# Patient Record
Sex: Female | Born: 2000 | Hispanic: No | Marital: Single | Smoking: Never smoker
Health system: Southern US, Community
[De-identification: ages and names within clinical notes are randomized; demographics above are authoritative.]

## PROBLEM LIST (undated history)

## (undated) DIAGNOSIS — E119 Type 2 diabetes mellitus without complications: Secondary | ICD-10-CM

---

## 2020-10-24 ENCOUNTER — Other Ambulatory Visit: Payer: Self-pay

## 2020-10-24 ENCOUNTER — Encounter: Payer: Self-pay | Admitting: Emergency Medicine

## 2020-10-24 ENCOUNTER — Ambulatory Visit
Admission: EM | Admit: 2020-10-24 | Discharge: 2020-10-24 | Disposition: A | Discharge status: Home / Self Care | Payer: BLUE CROSS/BLUE SHIELD | Attending: Emergency Medicine | Admitting: Emergency Medicine

## 2020-10-24 DIAGNOSIS — H66001 Acute suppurative otitis media without spontaneous rupture of ear drum, right ear: Secondary | ICD-10-CM | POA: Diagnosis not present

## 2020-10-24 HISTORY — DX: Type 2 diabetes mellitus without complications: E11.9

## 2020-10-24 LAB — POCT RAPID STREP A (OFFICE): Rapid Strep A Screen: NEGATIVE

## 2020-10-24 MED ORDER — AZITHROMYCIN 250 MG PO TABS: ORAL_TABLET | ORAL | 0 refills | Status: DC

## 2020-10-24 NOTE — ED Provider Notes (Signed)
[facility]    CSN: [id number] Arrival date & time: 10/24/20  1342      History   Chief Complaint Chief Complaint  Patient presents with  . Sore Throat    HPI Lindsey [name] is a 20 y.o. female presenting today for evaluation of URI symptoms.  Reports sore throat, congestion, headaches fevers and swollen lymph nodes.  Reports right-sided ear pain and sore throat recently and concerned about strep.  Had Covid test done at external site which is still pending.  Denies known exposure.  Denies any significant cough, reports mild congestion.  Denies fevers chills or body aches.  HPI  Past Medical History:  Diagnosis Date  . Diabetes mellitus without complication (HCC)     There are no problems to display for this patient.   History reviewed. No pertinent surgical history.  OB History   No obstetric history on file.      Home Medications    Prior to Admission medications   Medication Sig Start Date End Date Taking? Authorizing Provider  azithromycin (ZITHROMAX Z-PAK) 250 MG tablet Take 2 tablets today, 1 tablet for the following 4 days 10/24/20  Yes Lakera Viall C, PA-C  Insulin Human (INSULIN PUMP) SOLN Inject into the skin 3 times daily with meals, bedtime and 2 AM.   Yes [provider]    Family History History reviewed. No pertinent family history.  Social History Social History   Tobacco Use  . Smoking status: Never Smoker  . Smokeless tobacco: Never Used  Substance Use Topics  . Alcohol use: Not Currently  . Drug use: Never     Allergies   Aleve [naproxen], Cephalosporins, and Penicillins   Review of Systems Review of Systems  Constitutional: Positive for chills and fever. Negative for activity change, appetite change and fatigue.  HENT: Positive for congestion, ear pain, rhinorrhea and sore throat. Negative for sinus pressure and trouble swallowing.   Eyes: Negative for discharge and redness.  Respiratory: Positive for cough.  Negative for chest tightness and shortness of breath.   Cardiovascular: Negative for chest pain.  Gastrointestinal: Negative for abdominal pain, diarrhea, nausea and vomiting.  Musculoskeletal: Negative for myalgias.  Skin: Negative for rash.  Neurological: Positive for headaches. Negative for dizziness and light-headedness.     Physical Exam Triage Vital Signs ED Triage Vitals  Enc Vitals Group     BP      Pulse      Resp      Temp      Temp src      SpO2      Weight      Height      Head Circumference      Peak Flow      Pain Score      Pain Loc      Pain Edu?      Excl. in GC?    No data found.  Updated Vital Signs BP 122/82 (BP Location: Left Arm)   Pulse (!) 108   Temp 98.4 F (36.9 C) (Oral)   Resp 18   SpO2 98%   Visual Acuity Right Eye Distance:   Left Eye Distance:   Bilateral Distance:    Right Eye Near:   Left Eye Near:    Bilateral Near:     Physical Exam Vitals and nursing note reviewed.  Constitutional:      Appearance: She is well-developed and well-nourished.     Comments: No acute distress  HENT:  Head: Normocephalic and atraumatic.     Ears:     Comments: Bilateral ears without tenderness to palpation of external auricle, tragus and mastoid, EAC's without erythema or swelling,   Right TM dull erythematous and bulging     Nose: Nose normal.     Mouth/Throat:     Comments: Oral mucosa pink and moist, no tonsillar enlargement or exudate. Posterior pharynx patent and nonerythematous, no uvula deviation or swelling. Normal phonation. Eyes:     Conjunctiva/sclera: Conjunctivae normal.  Cardiovascular:     Rate and Rhythm: Normal rate.  Pulmonary:     Effort: Pulmonary effort is normal. No respiratory distress.     Comments: Breathing comfortably at rest, CTABL, no wheezing, rales or other adventitious sounds auscultated Abdominal:     General: There is no distension.  Musculoskeletal:        General: Normal range of motion.      Cervical back: Neck supple.  Skin:    General: Skin is warm and dry.  Neurological:     Mental Status: She is alert and oriented to person, place, and time.  Psychiatric:        Mood and Affect: Mood and affect normal.      UC Treatments / Results  Labs (all labs ordered are listed, but only abnormal results are displayed) Labs Reviewed  CULTURE, GROUP A STREP [facility])  POCT RAPID STREP A (OFFICE)    EKG   Radiology No results found.  Procedures Procedures (including critical care time)  Medications Ordered in UC Medications - No data to display  Initial Impression / Assessment and Plan / UC Course  I have reviewed the triage vital signs and the nursing notes.  Pertinent labs & imaging results that were available during my care of the patient were reviewed by me and considered in my medical decision making (see chart for details).     Strep test negative, right otitis media, has reported allergies to penicillin and cephalosporins, will treat with azithromycin.  Continue symptomatic and supportive care as well.  Monitor for gradual improvement.  Discussed strict return precautions. Patient verbalized understanding and is agreeable with plan.  Final Clinical Impressions(s) / UC Diagnoses   Final diagnoses:  Non-recurrent acute suppurative otitis media of right ear without spontaneous rupture of tympanic membrane     Discharge Instructions     Right ear infection Strep test negative Monitor for Covid results Begin course of azithromycin Tylenol and ibuprofen for pain Follow-up if not improving or worsening     ED Prescriptions    Medication Sig Dispense Auth. Provider   azithromycin (ZITHROMAX Z-PAK) 250 MG tablet Take 2 tablets today, 1 tablet for the following 4 days 6 tablet Kinsley Holderman C, PA-C     PDMP not reviewed this encounter.   Lew Dawes, [place] 10/24/20 [phone]

## 2020-10-24 NOTE — Discharge Instructions (Addendum)
Right ear infection Strep test negative Monitor for Covid results Begin course of azithromycin Tylenol and ibuprofen for pain Follow-up if not improving or worsening

## 2020-10-24 NOTE — ED Triage Notes (Signed)
Pt sts sore throat and not feeling well x 4 days; pt sts has covid test pending but would like to be checked for strep

## 2020-10-27 LAB — CULTURE, GROUP A STREP (THRC)

## 2022-01-26 ENCOUNTER — Other Ambulatory Visit (HOSPITAL_COMMUNITY)
Admission: RE | Admit: 2022-01-26 | Discharge: 2022-01-26 | Disposition: A | Discharge status: Home / Self Care | Payer: No Typology Code available for payment source | Source: Ambulatory Visit | Attending: Nurse Practitioner | Admitting: Nurse Practitioner

## 2022-01-26 ENCOUNTER — Other Ambulatory Visit: Payer: Self-pay | Admitting: Nurse Practitioner

## 2022-01-26 DIAGNOSIS — Z124 Encounter for screening for malignant neoplasm of cervix: Secondary | ICD-10-CM | POA: Insufficient documentation

## 2022-01-30 LAB — CYTOLOGY - PAP: Diagnosis: NEGATIVE

## 2022-03-09 ENCOUNTER — Ambulatory Visit (INDEPENDENT_AMBULATORY_CARE_PROVIDER_SITE_OTHER): Payer: No Typology Code available for payment source | Admitting: Nurse Practitioner

## 2022-03-09 ENCOUNTER — Encounter: Payer: Self-pay | Admitting: Nurse Practitioner

## 2022-03-09 VITALS — BP 132/84 | HR 101 | Temp 97.9°F | Ht 61.0 in | Wt 142.1 lb

## 2022-03-09 DIAGNOSIS — M25511 Pain in right shoulder: Secondary | ICD-10-CM | POA: Diagnosis not present

## 2022-03-09 DIAGNOSIS — E109 Type 1 diabetes mellitus without complications: Secondary | ICD-10-CM | POA: Diagnosis not present

## 2022-03-09 DIAGNOSIS — F319 Bipolar disorder, unspecified: Secondary | ICD-10-CM

## 2022-03-09 DIAGNOSIS — G8929 Other chronic pain: Secondary | ICD-10-CM

## 2022-03-09 DIAGNOSIS — B379 Candidiasis, unspecified: Secondary | ICD-10-CM | POA: Diagnosis not present

## 2022-03-09 DIAGNOSIS — M25311 Other instability, right shoulder: Secondary | ICD-10-CM

## 2022-03-09 LAB — POCT URINALYSIS DIP (CLINITEK)
Blood, UA: NEGATIVE
Glucose, UA: 250 mg/dL — AB
Leukocytes, UA: NEGATIVE
Nitrite, UA: NEGATIVE
POC PROTEIN,UA: 30 — AB
Spec Grav, UA: >=1.03 — AB (ref 1.010–1.025)
Urobilinogen, UA: 0.2 E.U./dL
pH, UA: 6 (ref 5.0–8.0)

## 2022-03-09 MED ORDER — FLUCONAZOLE 150 MG PO TABS: 150.0000 mg | ORAL_TABLET | Freq: Every day | ORAL | 0 refills | Status: DC

## 2022-03-09 MED ORDER — LAMOTRIGINE 100 MG PO TABS
100.0000 mg | ORAL_TABLET | Freq: Every day | ORAL | 2 refills | Status: DC
Start: 2022-03-09 — End: 2022-05-30

## 2022-03-09 MED ORDER — VENLAFAXINE HCL ER 225 MG PO TB24: 1.0000 | ORAL_TABLET | Freq: Every day | ORAL | 2 refills | Status: DC

## 2022-03-09 NOTE — Progress Notes (Signed)
@Patient  ID: Lindsey [name], female    DOB: [birth year], 21 y.o.   MRN: [id number]  Chief Complaint  Patient presents with   Establish Care    Pt is here to establish care. Pt is requesting a physical to be done.pt is would like a referral orthopedics for a shoulder injury. Pt stated she has a yeast infection white thick discharge    Referring provider: No ref. provider found   HPI  Patient presents today to establish care.  She does have a history of type 1 diabetes and bipolar disorder.  Patient does have a history of right shoulder injury when she was 21 years old.  She states that she does need a referral to Ortho for ongoing pain and shoulder instability.  Patient is established with endocrinology to manage her diabetes.  Patient does need a referral for psychiatry to manage psychiatric medications. Denies f/c/s, n/v/d, hemoptysis, PND, chest pain or edema.       Allergies  Allergen Reactions   Justicia Adhatoda (Malabar Nut Tree) [Justicia Adhatoda] Anaphylaxis   Peanut-Containing Drug Products Anaphylaxis   Aleve [Naproxen]    Cephalosporins    Penicillins      There is no immunization history on file for this patient.  Past Medical History:  Diagnosis Date   Diabetes mellitus without complication (HCC)     Tobacco History: Social History   Tobacco Use  Smoking Status Never  Smokeless Tobacco Never   Counseling given: Not Answered   Outpatient Encounter Medications as of 03/09/2022  Medication Sig   bisacodyl (DULCOLAX) 5 MG EC tablet Take 5 mg by mouth daily as needed for moderate constipation.   fluconazole (DIFLUCAN) 150 MG tablet Take 1 tablet (150 mg total) by mouth daily.   insulin aspart (NOVOLOG) 100 UNIT/ML injection 100 Units See admin instructions.   Insulin Human (INSULIN PUMP) SOLN Inject into the skin 3 times daily with meals, bedtime and 2 AM.   loratadine (CLARITIN) 10 MG tablet Take 10 mg by mouth once.   Probiotic Product (PROBIOTIC DAILY PO)  Take by mouth.   [DISCONTINUED] lamoTRIgine (LAMICTAL) 100 MG tablet Take 100 mg by mouth daily.   [DISCONTINUED] Venlafaxine HCl 225 MG TB24 Take 1 tablet by mouth daily.   azithromycin (ZITHROMAX Z-PAK) 250 MG tablet Take 2 tablets today, 1 tablet for the following 4 days   lamoTRIgine (LAMICTAL) 100 MG tablet Take 1 tablet (100 mg total) by mouth daily.   Venlafaxine HCl 225 MG TB24 Take 1 tablet (225 mg total) by mouth daily.   No facility-administered encounter medications on file as of 03/09/2022.     Review of Systems  Review of Systems  Constitutional: Negative.   HENT: Negative.    Cardiovascular: Negative.   Gastrointestinal: Negative.   Allergic/Immunologic: Negative.   Neurological: Negative.   Psychiatric/Behavioral: Negative.        Physical Exam  BP 132/84 (BP Location: Right Arm, Patient Position: Sitting, Cuff Size: Normal)   Pulse (!) 101   Temp 97.9 F (36.6 C)   Ht 5\' 1"  (1.549 m)   Wt 142 lb 1.6 oz (64.5 kg)   SpO2 100%   BMI 26.85 kg/m   Wt Readings from Last 5 Encounters:  03/09/22 142 lb 1.6 oz (64.5 kg)     Physical Exam Vitals and nursing note reviewed.  Constitutional:      General: She is not in acute distress.    Appearance: She is well-developed.  Cardiovascular:     Rate and  Rhythm: Normal rate and regular rhythm.  Pulmonary:     Effort: Pulmonary effort is normal.     Breath sounds: Normal breath sounds.  Neurological:     Mental Status: She is alert and oriented to person, place, and time.       Assessment & Plan:   Bipolar 1 disorder (HCC) - Ambulatory referral to Psychiatry - NuSwab Vaginitis Plus (VG+) - POCT URINALYSIS DIP (CLINITEK) - Venlafaxine HCl 225 MG TB24; Take 1 tablet (225 mg total) by mouth daily.  Dispense: 30 tablet; Refill: 2 - lamoTRIgine (LAMICTAL) 100 MG tablet; Take 1 tablet (100 mg total) by mouth daily.  Dispense: 30 tablet; Refill: 2  2. Type 1 diabetes mellitus without complication  (HCC)  Continue to follow with endocrinology  3. Yeast infection  - fluconazole (DIFLUCAN) 150 MG tablet; Take 1 tablet (150 mg total) by mouth daily.  Dispense: 2 tablet; Refill: 0   Follow up in 4 weeks for physical     Ivonne Andrew, NP 03/09/2022

## 2022-03-09 NOTE — Assessment & Plan Note (Signed)
-   Ambulatory referral to Psychiatry - NuSwab Vaginitis Plus (VG+) - POCT URINALYSIS DIP (CLINITEK) - Venlafaxine HCl 225 MG TB24; Take 1 tablet (225 mg total) by mouth daily.  Dispense: 30 tablet; Refill: 2 - lamoTRIgine (LAMICTAL) 100 MG tablet; Take 1 tablet (100 mg total) by mouth daily.  Dispense: 30 tablet; Refill: 2  2. Type 1 diabetes mellitus without complication (HCC)  Continue to follow with endocrinology  3. Yeast infection  - fluconazole (DIFLUCAN) 150 MG tablet; Take 1 tablet (150 mg total) by mouth daily.  Dispense: 2 tablet; Refill: 0   Follow up in 4 weeks for physical

## 2022-03-09 NOTE — Patient Instructions (Addendum)
1. Bipolar 1 disorder (HCC)  - Ambulatory referral to Psychiatry - NuSwab Vaginitis Plus (VG+) - POCT URINALYSIS DIP (CLINITEK) - Venlafaxine HCl 225 MG TB24; Take 1 tablet (225 mg total) by mouth daily.  Dispense: 30 tablet; Refill: 2 - lamoTRIgine (LAMICTAL) 100 MG tablet; Take 1 tablet (100 mg total) by mouth daily.  Dispense: 30 tablet; Refill: 2  2. Type 1 diabetes mellitus without complication (HCC)  Continue to follow with endocrinology  3. Yeast infection  - fluconazole (DIFLUCAN) 150 MG tablet; Take 1 tablet (150 mg total) by mouth daily.  Dispense: 2 tablet; Refill: 0   Follow up in 4 weeks for physical

## 2022-03-13 LAB — NUSWAB VAGINITIS PLUS (VG+)
Atopobium vaginae: HIGH Score — AB
Candida albicans, NAA: POSITIVE — AB
Candida glabrata, NAA: NEGATIVE
Chlamydia trachomatis, NAA: NEGATIVE
Neisseria gonorrhoeae, NAA: NEGATIVE
Trich vag by NAA: NEGATIVE

## 2022-03-16 ENCOUNTER — Other Ambulatory Visit: Payer: Self-pay | Admitting: Nurse Practitioner

## 2022-03-16 ENCOUNTER — Ambulatory Visit (INDEPENDENT_AMBULATORY_CARE_PROVIDER_SITE_OTHER): Payer: No Typology Code available for payment source | Admitting: Orthopedic Surgery

## 2022-03-16 ENCOUNTER — Ambulatory Visit (INDEPENDENT_AMBULATORY_CARE_PROVIDER_SITE_OTHER): Payer: No Typology Code available for payment source

## 2022-03-16 DIAGNOSIS — M79601 Pain in right arm: Secondary | ICD-10-CM | POA: Diagnosis not present

## 2022-03-16 MED ORDER — FLUCONAZOLE 150 MG PO TABS: 150.0000 mg | ORAL_TABLET | Freq: Every day | ORAL | 0 refills | Status: DC

## 2022-03-17 ENCOUNTER — Telehealth: Payer: Self-pay | Admitting: Clinical

## 2022-03-17 NOTE — Telephone Encounter (Signed)
Integrated Behavioral Health Case Management Referral Note  03/17/2022 Name: Miamarie Moll MRN: [id number] DOB: Mar 08, 2001 Ebonique Pereira is a 21 y.o. year old female who sees Ivonne Andrew, NP for primary care. LCSW was consulted to assess patient's needs and assist the patient with Mental Health Counseling and Resources.  Interpreter: No.   Interpreter Name & Language: none  Assessment: Patient experiencing Mental Health Concerns .   Intervention: Patient was referred to CSW for assistance with psychiatry referral and counseling after last PCP visit. CSW called patient and patient returned call today. She does agree to a referral to psychiatry. As [facility] is scheduling out to end of August, will refer to [facility]), as they have sooner appointments. Coordinating with [facility] referral coordinator for this. She would like to verify some of her benefits with her insurance company before starting therapy. CSW to follow and assist with therapy referral as needed.  Patient also indicated that her Venlafaxine requires a prior authorization. Forwarded this information to CMA for assistance.   SDOH (Social Determinants of Health) assessments performed: No  Review of patient status, including review of consultants reports, relevant laboratory and other test results, and collaboration with appropriate care team members and the patient's provider was performed as part of comprehensive patient evaluation and provision of services.    Abigail Butts, LCSW [facility] [phone]

## 2022-03-19 ENCOUNTER — Encounter: Payer: Self-pay | Admitting: Orthopedic Surgery

## 2022-03-19 NOTE — Progress Notes (Signed)
Office Visit Note   Patient: Lindsey [name]           Date of Birth: 2001-01-22           MRN: [id number] Visit Date: 03/16/2022 Requested by: Ivonne Andrew, NP [phone] [address], [address] [place],  [place] [address] PCP: Ivonne Andrew, NP  Subjective: Chief Complaint  Patient presents with   Other    Right arm/shoulder pain with numbness    HPI: Lindsey [name] is a 21 year old patient with right shoulder pain.  Has had chronic pain in the shoulder and arm for 4 months.  She is right-hand dominant.  Pain wakes her from sleep at night.  She describes having rotator cuff injury playing sports in high school.  She managed with injections for a while.  She was told by someone else that surgery was needed but she declined at that time.  Now she reports worsening pain.  Does describe some numbness about once a day which occurs mostly at night.  Does not report as much neck pain as she does shoulder and scapular pain.  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  Her numbness is a more concerning aspect of this than pain.  She reports occasional scapular pain on the right.  Denies any loss of motion.  States that her arm numbness is most bothersome.  She does get relief by putting her arm up overhead.              ROS: All systems reviewed are negative as they relate to the chief complaint within the history of present illness.  Patient denies  fevers or chills.   Assessment & Plan: Visit Diagnoses:  1. Right arm pain     Plan: Impression is right shoulder and arm pain which could be thoracic outlet syndrome versus shoulder pathology.  Syrinx is also a possibility.  Symptoms ongoing now for 4 months.  Shoulder stability looks good and there is no atrophy or asymmetric reflexes in the upper extremities.  She has failed conservative management.  Plan is MRI arthrogram of the right shoulder to evaluate the labrum and MRI of the cervical spine to evaluate right-sided radiculopathy.   Follow-up after that study.  Follow-Up Instructions: Return for after MRI.   Orders:  Orders Placed This Encounter  Procedures   XR Shoulder Right   XR Cervical Spine 2 or 3 views   MR Cervical Spine w/o contrast   MR SHOULDER RIGHT W CONTRAST   Arthrogram   No orders of the defined types were placed in this encounter.     Procedures: No procedures performed   Clinical Data: No additional findings.  Objective: Vital Signs: There were no vitals taken for this visit.  Physical Exam:   Constitutional: Patient appears well-developed HEENT:  Head: Normocephalic Eyes:EOM are normal Neck: Normal range of motion Cardiovascular: Normal rate Pulmonary/chest: Effort normal Neurologic: Patient is alert Skin: Skin is warm Psychiatric: Patient has normal mood and affect   Ortho Exam: Ortho exam demonstrates good cervical spine range of motion flexion chin to chest extension 50 degrees rotation 60 degrees bilaterally.  5 out of 5 grip EPL FPL interosseous wrist flexion extension bicep triceps and deltoid strength with bilateral biceps and triceps reflexes 1+ out of 4.  Radial pulse intact.  No masses lymphadenopathy or skin changes noted in the neck or shoulder girdle region on the right-hand side.  Negative apprehension relocation testing on the right.  Pulses maintained  with AB duction and external rotation.  Specialty Comments:  No specialty comments available.  Imaging: No results found.   PMFS History: Patient Active Problem List   Diagnosis Date Noted   Bipolar 1 disorder (HCC) 03/09/2022   Past Medical History:  Diagnosis Date   Diabetes mellitus without complication (HCC)     Family History  Problem Relation Age of Onset   Depression Mother    Cancer Maternal Aunt    Stroke Paternal Aunt    Heart disease Maternal Grandmother    Diabetes Maternal Grandfather    Heart disease Paternal Grandmother    Stroke Paternal Grandfather     History reviewed. No  pertinent surgical history. Social History   Occupational History   Not on file  Tobacco Use   Smoking status: Never   Smokeless tobacco: Never  Vaping Use   Vaping Use: Some days  Substance and Sexual Activity   Alcohol use: Not Currently   Drug use: Yes    Types: Marijuana   Sexual activity: Yes

## 2022-03-24 ENCOUNTER — Other Ambulatory Visit: Payer: Self-pay | Admitting: Nurse Practitioner

## 2022-03-29 ENCOUNTER — Other Ambulatory Visit: Payer: Self-pay | Admitting: Family Medicine

## 2022-03-29 ENCOUNTER — Telehealth: Payer: Self-pay

## 2022-03-29 DIAGNOSIS — F319 Bipolar disorder, unspecified: Secondary | ICD-10-CM

## 2022-03-29 MED ORDER — VENLAFAXINE HCL ER 75 MG PO TB24
75.0000 mg | ORAL_TABLET | Freq: Every day | ORAL | 2 refills | Status: DC
Start: 2022-03-29 — End: 2022-04-06

## 2022-03-29 MED ORDER — VENLAFAXINE HCL ER 150 MG PO TB24
150.0000 mg | ORAL_TABLET | Freq: Every day | ORAL | 2 refills | Status: DC
Start: 2022-03-29 — End: 2022-04-06

## 2022-03-29 NOTE — Telephone Encounter (Addendum)
Venlasixine  2.25 dosage or they split it up to 175 and 50   SO SHE WILL NOT NEED THE PRIOR AUTHORIZATION  Hey [place] can you please help me with this one?? Please read the encounters notes!

## 2022-03-29 NOTE — Progress Notes (Signed)
Meds ordered this encounter  Medications   Venlafaxine HCl 150 MG TB24    Sig: Take 1 tablet (150 mg total) by mouth daily.    Dispense:  30 tablet    Refill:  2    Order Specific Question:   Supervising Provider    Answer:   Quentin Angst [id number]   Venlafaxine HCl 75 MG TB24    Sig: Take 1 tablet (75 mg total) by mouth daily.    Dispense:  30 tablet    Refill:  2    Order Specific Question:   Supervising Provider    Answer:   Quentin Angst [[id number]]   Nolon Nations  APRN, MSN, FNP-C [facility] [address] [place], [place] [address] [phone]

## 2022-03-30 ENCOUNTER — Telehealth: Payer: Self-pay

## 2022-03-30 ENCOUNTER — Ambulatory Visit
Admission: RE | Admit: 2022-03-30 | Discharge: 2022-03-30 | Disposition: A | Discharge status: Home / Self Care | Payer: No Typology Code available for payment source | Source: Ambulatory Visit | Attending: Orthopedic Surgery | Admitting: Orthopedic Surgery

## 2022-03-30 DIAGNOSIS — M79601 Pain in right arm: Secondary | ICD-10-CM

## 2022-03-30 IMAGING — XA DG FLUORO GUIDE NDL PLC/BX
2 series · 2 of 2 positions shown · non-contrast
Comparison: none

CLINICAL DATA: 21-year-old female with chronic right shoulder pain.

[Series 1: ortho standard · 1 of 1 slices shown (1 of 2)]
[im 1/1]
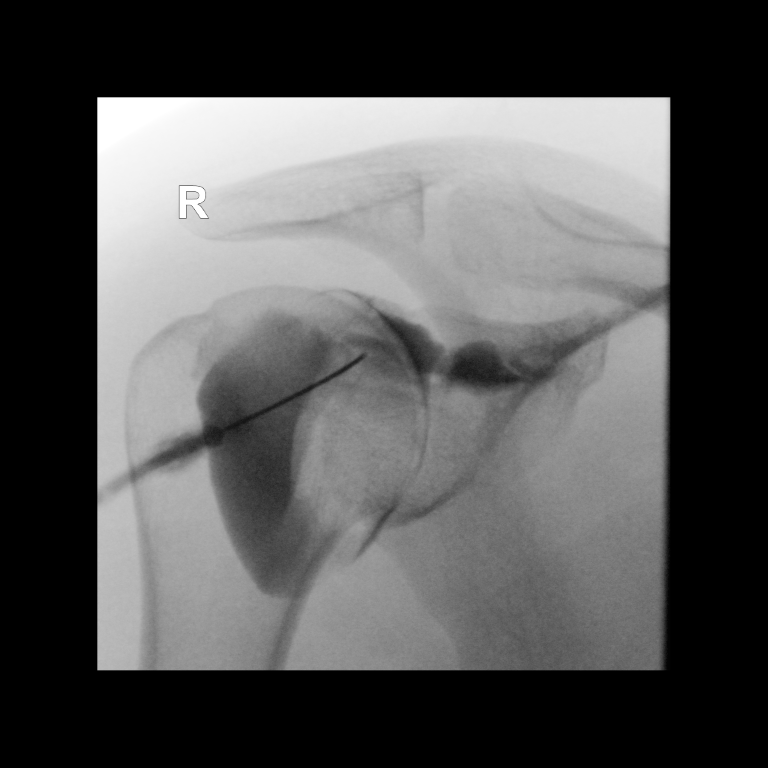

[Series 2: ortho standard · 1 of 1 slices shown (2 of 2)]
[im 1/1]
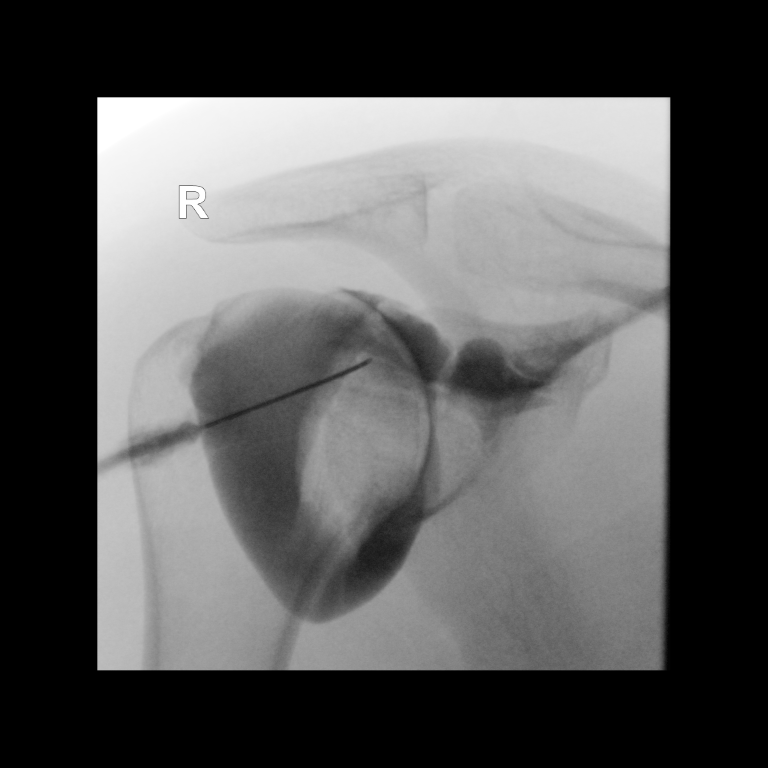

[2 of 2 positions shown; findings below may reference images not displayed]

EXAM:
SHOULDER INJECTION FOR MRI

FLUOROSCOPY:
Radiation exposure index: 2.2 mGy reference air kerma

PROCEDURE:
After a thorough discussion of risks and benefits of the procedure,
written and oral informed consent was obtained. The consent
discussion included the risk of bleeding, infection and injury to
nerves and adjacent blood vessels. Extra-articular injection was
also a possible risk discussed.

Preliminary localization was performed over the right shoulder. The
area was marked over superior medial anterior humeral head.

After prep and drape in the usual sterile fashion, the skin and
deeper subcutaneous tissues were anesthetized with 1% Lidocaine
without Epinephrine. Under fluoroscopic guidance, a 22 gauge
inch spinal needle was advanced into the joint over the superior
medial anterior humeral head using an anterior approach.
Intra-articular injection of Lidocaine was performed which flowed
freely and subsequently the joint was distended with 12 ml of a
[DATE] dilution of Multihance contrast. The MR arthrogram solution
was as follows: 10 mL Omnipaque 300 contrast agent, 0.1 mL
Multihance, 10 mL saline. An end point was felt as well as the
patient experiencing pressure and the injection was discontinued,
the needle removed, and a sterile dressing applied. The patient was
taken to MRI for subsequent imaging.

The patient tolerated the procedure well and there were no
complications.
IMPRESSION: Successful right shoulder fluoroscopically guided injection.

## 2022-03-30 IMAGING — MR MR SHOULDER*R* W/CM
4 of 6 series · 23 of 40 positions shown · IV contrast (agent unspecified)
Comparison: None Available.

CLINICAL DATA: Right shoulder pain for 9 years

EXAM:
MRI OF THE RIGHT SHOULDER WITH CONTRAST
TECHNIQUE: Multiplanar, multisequence MR imaging of the right shoulder was
performed following the administration of intra-articular contrast.
CONTRAST:  See Injection Documentation.

[Series 3: (id) fs · axial · 4.0mm · 0.27mm/px · z∈[-31,+43]mm · 8 of 20 slices shown]
[im 1/20]
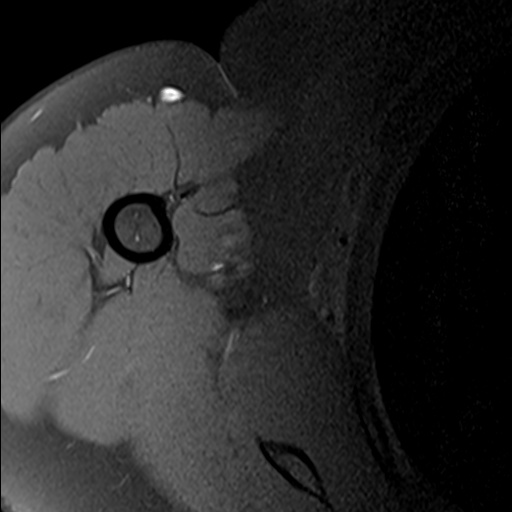
[im 3/20]
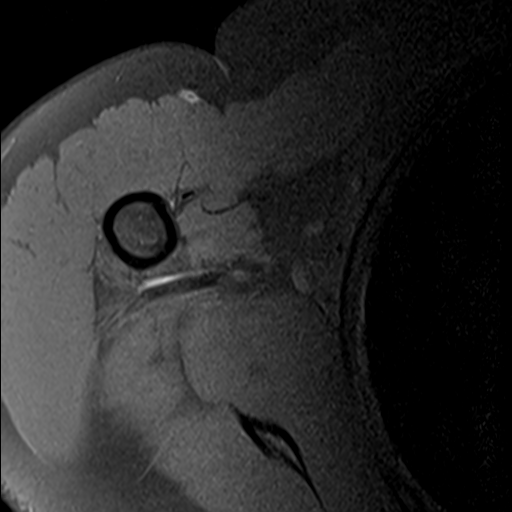
[im 5/20]
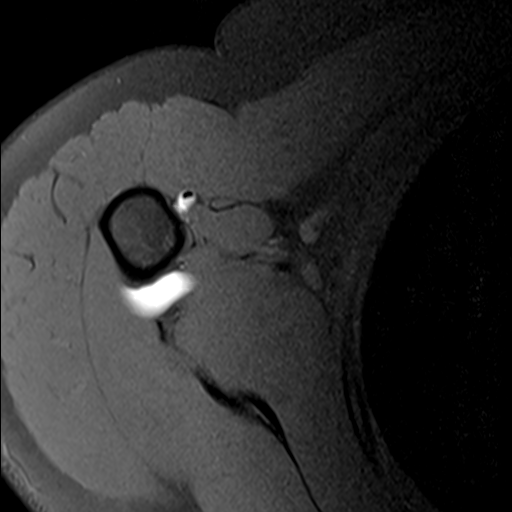
[im 8/20]
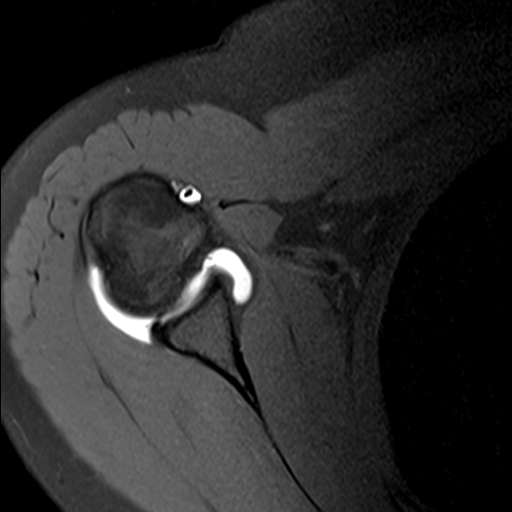
[im 10/20]
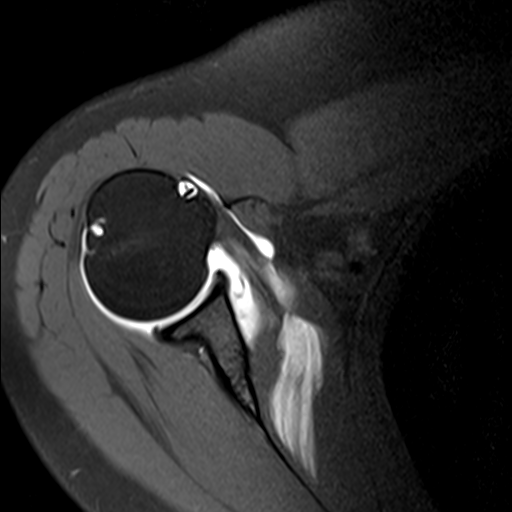
[im 12/20]
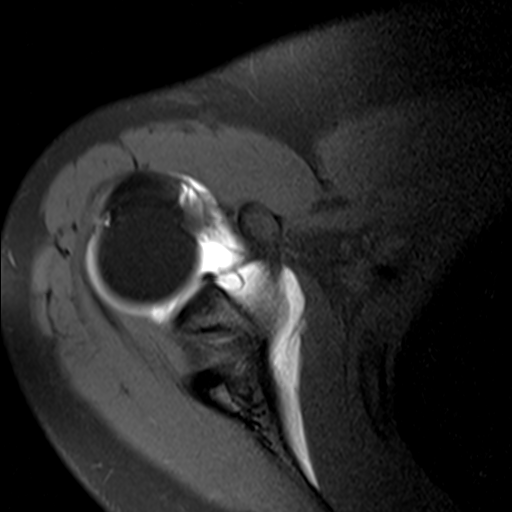
[im 15/20]
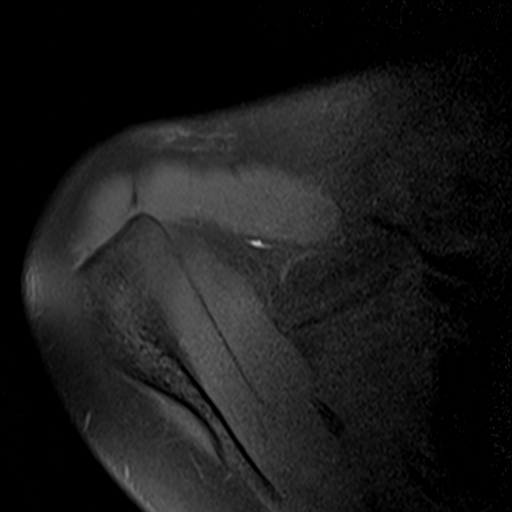
[im 17/20]
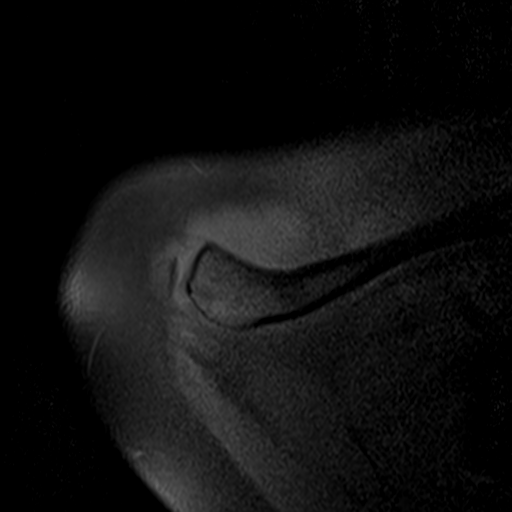

[Series 4: T1 fat-sat · oblique · 4.0mm · 0.27mm/px · 6 of 16 slices shown]
[im 1/16]
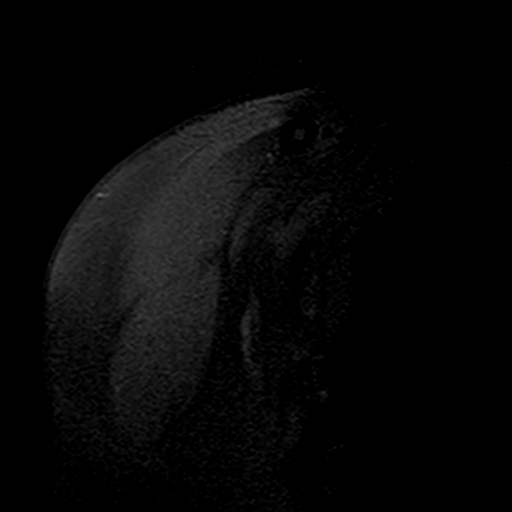
[im 4/16]
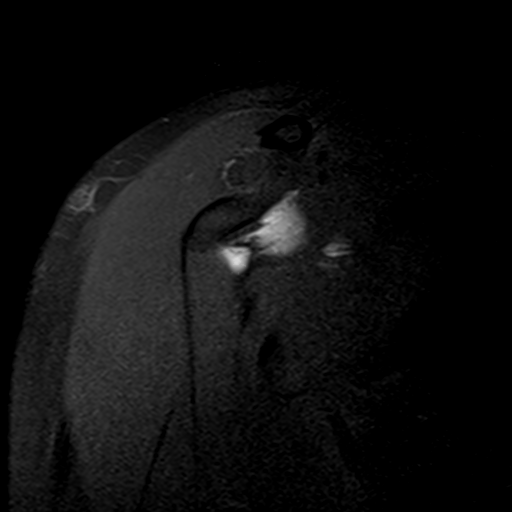
[im 7/16]
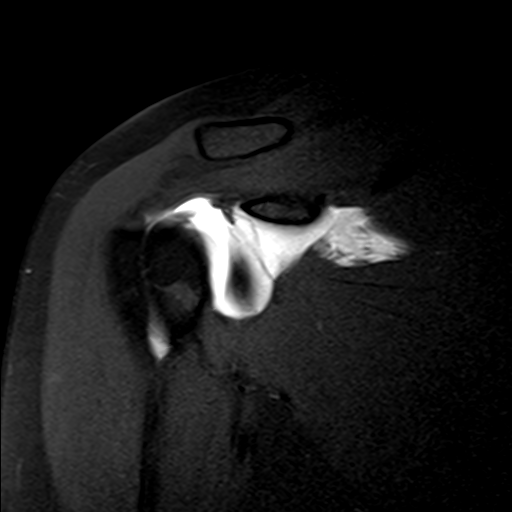
[im 10/16]
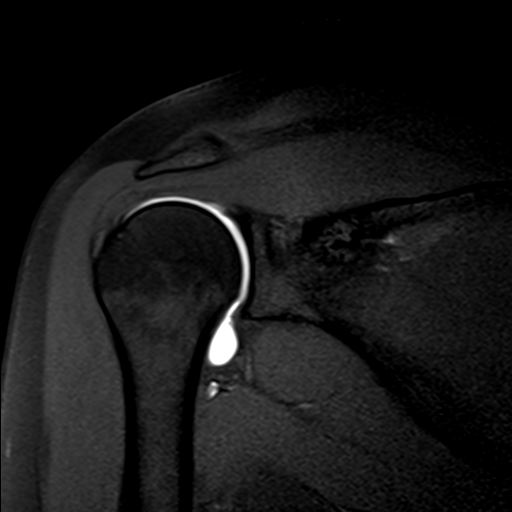
[im 13/16]
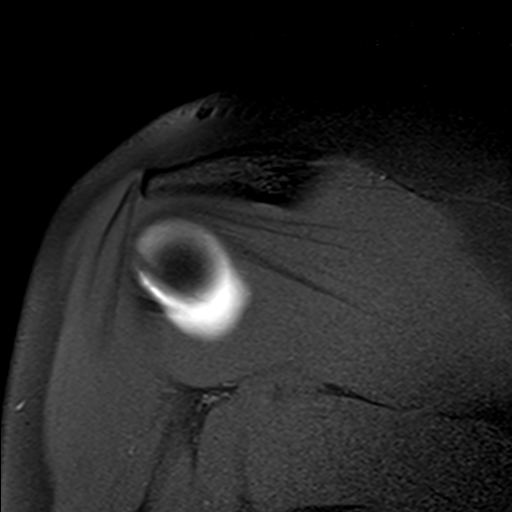
[im 16/16]
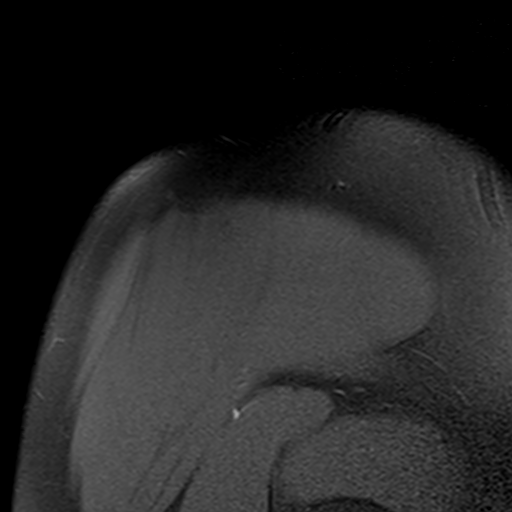

[Series 5: t2_tse_cor · oblique · 4.0mm · 0.27mm/px · 3 of 16 slices shown]
[im 4/16]
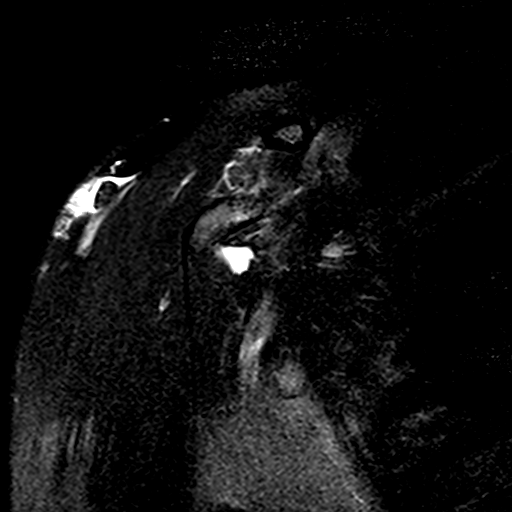
[im 10/16]
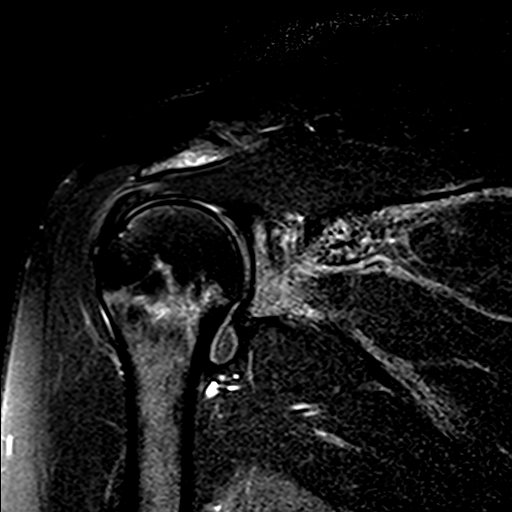
[im 16/16]
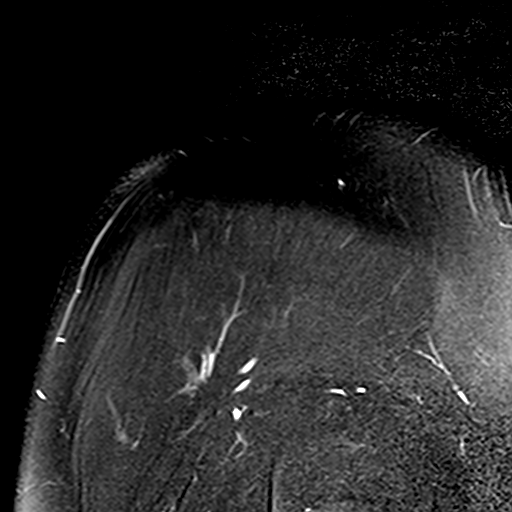

[Series 6: T1 · oblique · 4.0mm · 0.55mm/px · 6 of 16 slices shown]
[im 1/16]
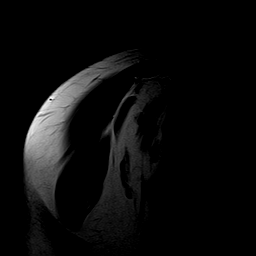
[im 4/16]
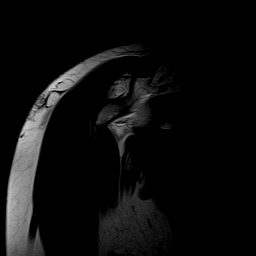
[im 7/16]
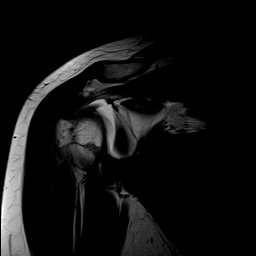
[im 10/16]
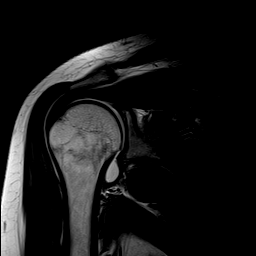
[im 13/16]
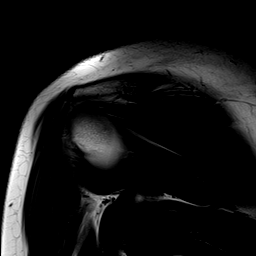
[im 16/16]
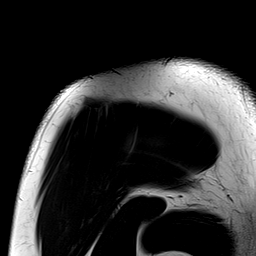

[23 of 40 positions shown; findings below may reference images not displayed]

FINDINGS: Rotator cuff: Mild tendinosis of the supraspinatus and infraspinatus
tendons. Infraspinatus tendon is intact. Teres minor tendon is
intact. Subscapularis tendon is intact.

Muscles: No muscle atrophy or edema. No intramuscular fluid
collection or hematoma.

Biceps Long Head: Intraarticular and extraarticular portions of the
biceps tendon are intact.

Acromioclavicular Joint: No significant arthropathy of the
acromioclavicular joint. No subacromial/subdeltoid bursal fluid.

Glenohumeral Joint: Intraarticular contrast distending the joint
capsule. Normal glenohumeral ligaments. No chondral defect.

Labrum: Tiny anterior labral tear seen on a single image (image
8/series 11). Otherwise no significant labral tear.

Bones: No fracture or dislocation. No aggressive osseous lesion.

Other: No fluid collection or hematoma.
IMPRESSION: 1. Mild tendinosis of the supraspinatus and infraspinatus tendons.
2. Tiny anterior labral tear seen on a single image. Otherwise no
significant labral tear.

## 2022-03-30 MED ORDER — IOPAMIDOL (ISOVUE-M 200) INJECTION 41%
15.0000 mL | Freq: Once | INTRAMUSCULAR | Status: AC
Start: 2022-03-30 — End: 2022-03-30
  Administered 2022-03-30: 15 mL via INTRA_ARTICULAR

## 2022-03-30 NOTE — Telephone Encounter (Signed)
Prior Authorization started for Venlafaxine HCL ER 150 mg was faxed to patient insurance waiting for approval

## 2022-03-31 ENCOUNTER — Emergency Department (HOSPITAL_COMMUNITY)
Admission: EM | Admit: 2022-03-31 | Discharge: 2022-04-01 | Disposition: A | Discharge status: Home / Self Care | Payer: No Typology Code available for payment source | Attending: Emergency Medicine | Admitting: Emergency Medicine

## 2022-03-31 ENCOUNTER — Encounter (HOSPITAL_COMMUNITY): Payer: Self-pay

## 2022-03-31 ENCOUNTER — Other Ambulatory Visit: Payer: Self-pay

## 2022-03-31 DIAGNOSIS — Z91010 Allergy to peanuts: Secondary | ICD-10-CM | POA: Insufficient documentation

## 2022-03-31 DIAGNOSIS — Z760 Encounter for issue of repeat prescription: Secondary | ICD-10-CM | POA: Diagnosis present

## 2022-03-31 DIAGNOSIS — Z794 Long term (current) use of insulin: Secondary | ICD-10-CM | POA: Diagnosis not present

## 2022-03-31 LAB — CBC WITH DIFFERENTIAL/PLATELET
Abs Immature Granulocytes: 0.03 10*3/uL (ref 0.00–0.07)
Basophils Absolute: 0.1 10*3/uL (ref 0.0–0.1)
Basophils Relative: 1 %
Eosinophils Absolute: 0.1 10*3/uL (ref 0.0–0.5)
Eosinophils Relative: 1 %
HCT: 40.6 % (ref 36.0–46.0)
Hemoglobin: 13.7 g/dL (ref 12.0–15.0)
Immature Granulocytes: 0 %
Lymphocytes Relative: 32 %
Lymphs Abs: 2.6 10*3/uL (ref 0.7–4.0)
MCH: 29 pg (ref 26.0–34.0)
MCHC: 33.7 g/dL (ref 30.0–36.0)
MCV: 85.8 fL (ref 80.0–100.0)
Monocytes Absolute: 0.5 10*3/uL (ref 0.1–1.0)
Monocytes Relative: 7 %
Neutro Abs: 4.7 10*3/uL (ref 1.7–7.7)
Neutrophils Relative %: 59 %
Platelets: 325 10*3/uL (ref 150–400)
RBC: 4.73 MIL/uL (ref 3.87–5.11)
RDW: 11.8 % (ref 11.5–15.5)
WBC: 7.9 10*3/uL (ref 4.0–10.5)
nRBC: 0 % (ref 0.0–0.2)

## 2022-03-31 LAB — BASIC METABOLIC PANEL
Anion gap: 8 (ref 5–15)
BUN: 10 mg/dL (ref 6–20)
CO2: 29 mmol/L (ref 22–32)
Calcium: 9.4 mg/dL (ref 8.9–10.3)
Chloride: 99 mmol/L (ref 98–111)
Creatinine, Ser: 0.71 mg/dL (ref 0.44–1.00)
GFR, Estimated: >60 mL/min (ref >60)
Glucose, Bld: 258 mg/dL — ABNORMAL HIGH (ref 70–99)
Potassium: 4.2 mmol/L (ref 3.5–5.1)
Sodium: 136 mmol/L (ref 135–145)

## 2022-03-31 NOTE — ED Provider Triage Note (Signed)
Emergency Medicine Provider Triage Evaluation Note  Lindsey [name] , [name] 21 y.o. female  was evaluated in triage.  Pt complains of concerns for medication refill onset today.  Patient takes 225 mg of Effexor.  Her primary care provider has not been able to prescribe her this medication due to needing [name] prior authorization for her insurance purposes.  Denies SI, vomiting.  Review of Systems  Positive: As per HPI above Negative:   Physical Exam  BP (!) 151/88   Pulse (!) 105   Temp (!) 97.4 F (36.3 C) (Oral)   Resp 16   SpO2 100%  Gen:   Awake, no distress   Resp:  Normal effort  MSK:   Moves extremities without difficulty  Other:  Tachycardia noted on exam  Medical Decision Making  Medically screening exam initiated at 9:19 PM.  Appropriate orders placed.  Lindsey [name] was informed that the remainder of the evaluation will be completed by another provider, this initial triage assessment does not replace that evaluation, and the importance of remaining in the ED until their evaluation is complete.  Work-up initiated   Lindsey [name] A, PA-C 03/31/22 2121

## 2022-03-31 NOTE — ED Triage Notes (Signed)
Seeking medication refill on Effexor XR 225mg  daily. Has todays dose but none for future. Unable to see with PCP.

## 2022-04-01 ENCOUNTER — Encounter (HOSPITAL_COMMUNITY): Payer: Self-pay | Admitting: Emergency Medicine

## 2022-04-01 ENCOUNTER — Ambulatory Visit
Admission: RE | Admit: 2022-04-01 | Discharge: 2022-04-01 | Disposition: A | Discharge status: Home / Self Care | Payer: No Typology Code available for payment source | Source: Ambulatory Visit | Attending: Orthopedic Surgery | Admitting: Orthopedic Surgery

## 2022-04-01 DIAGNOSIS — M79601 Pain in right arm: Secondary | ICD-10-CM

## 2022-04-01 IMAGING — MR MR CERVICAL SPINE W/O CM
4 of 5 series · 29 of 48 positions shown · non-contrast
Comparison: Radiograph from [DATE].

CLINICAL DATA: Initial evaluation for right radicular arm pain.

EXAM:
MRI CERVICAL SPINE WITHOUT CONTRAST
TECHNIQUE: Multiplanar, multisequence MR imaging of the cervical spine was
performed. No intravenous contrast was administered.

[Series 5: T2 · sagittal · 3.0mm · 0.55mm/px · 8 of 17 slices shown (1 of 2)]
[im 1/17]
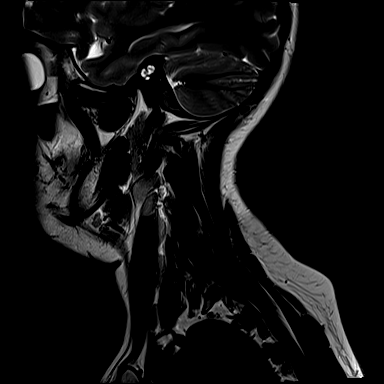
[im 3/17]
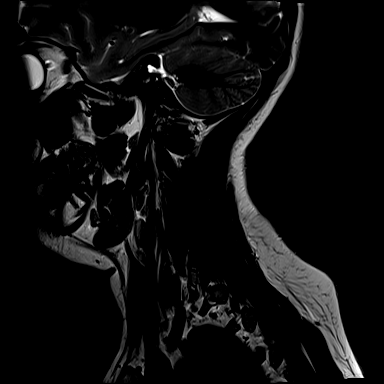
[im 5/17]
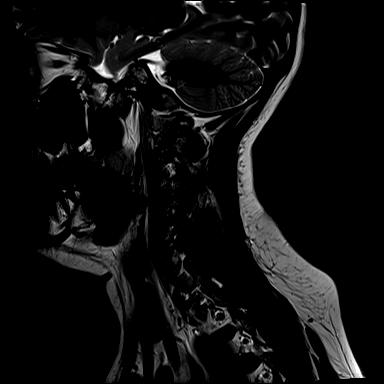
[im 7/17]
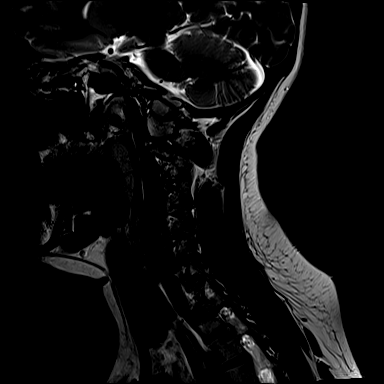
[im 10/17]
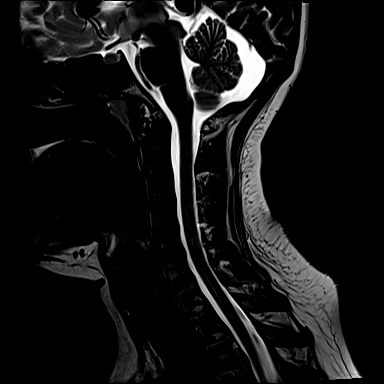
[im 12/17]
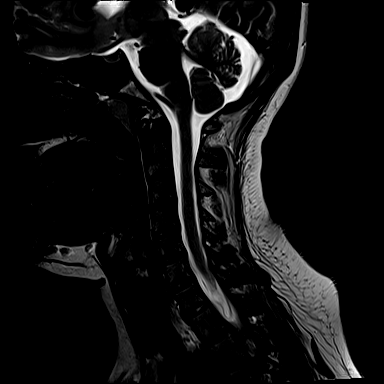
[im 14/17]
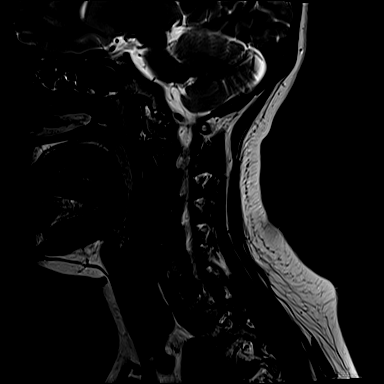
[im 17/17]
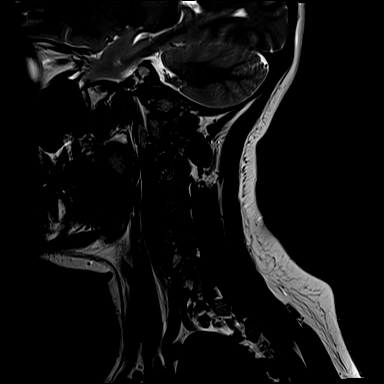

[Series 6: T1 · sagittal · 3.0mm · 0.66mm/px · 7 of 15 slices shown]
[im 1/15]
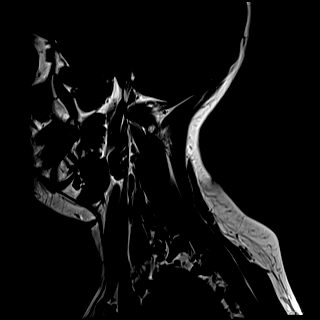
[im 3/15]
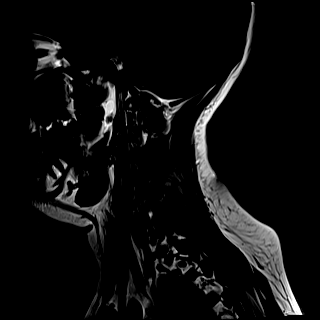
[im 5/15]
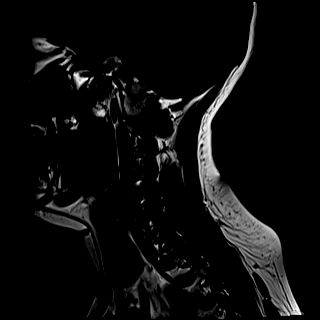
[im 8/15]
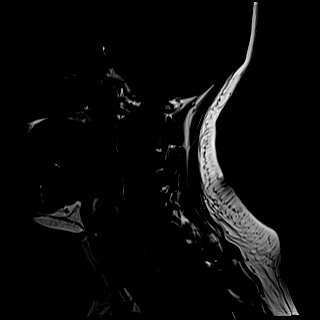
[im 10/15]
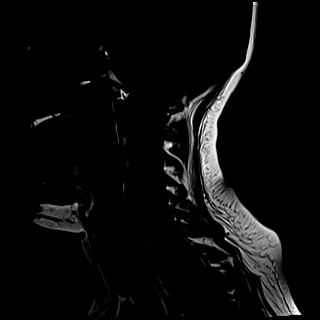
[im 12/15]
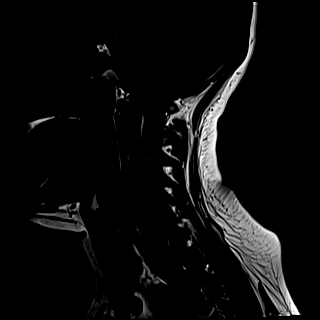
[im 15/15]
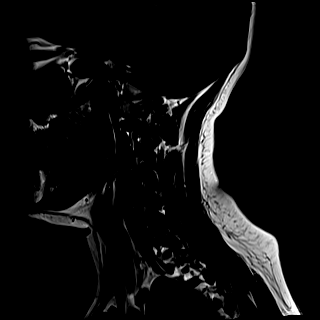

[Series 7: STIR · sagittal · 3.0mm · 0.33mm/px · 5 of 15 slices shown]
[im 1/15]
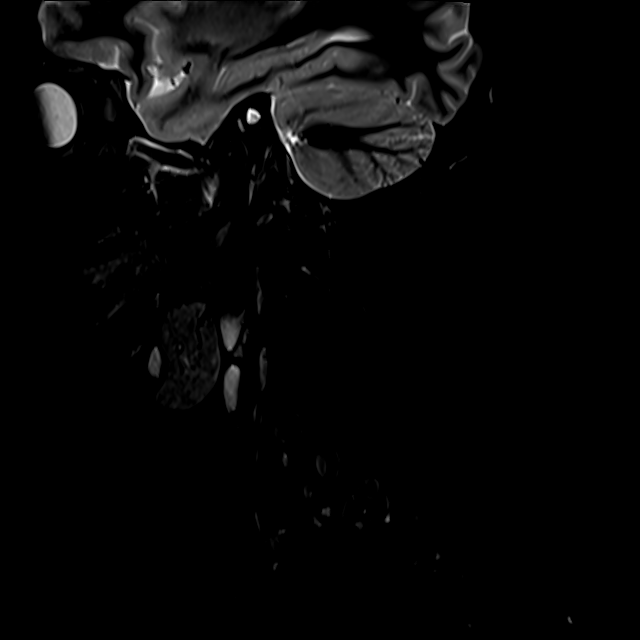
[im 3/15]
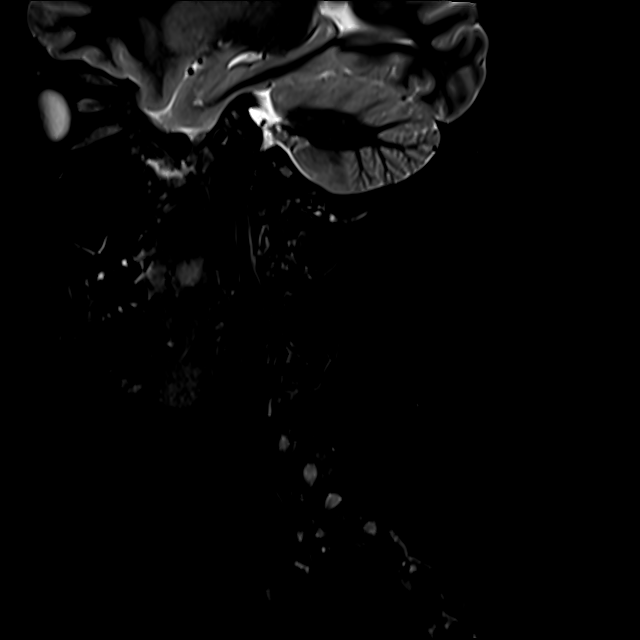
[im 5/15]
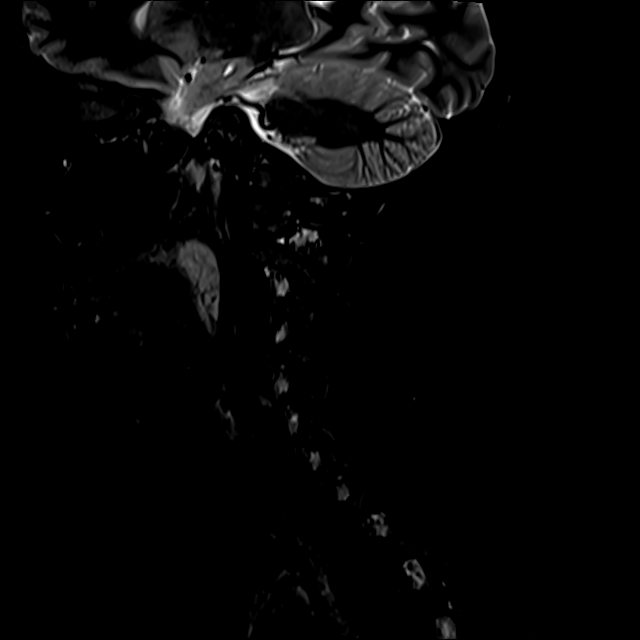
[im 8/15]
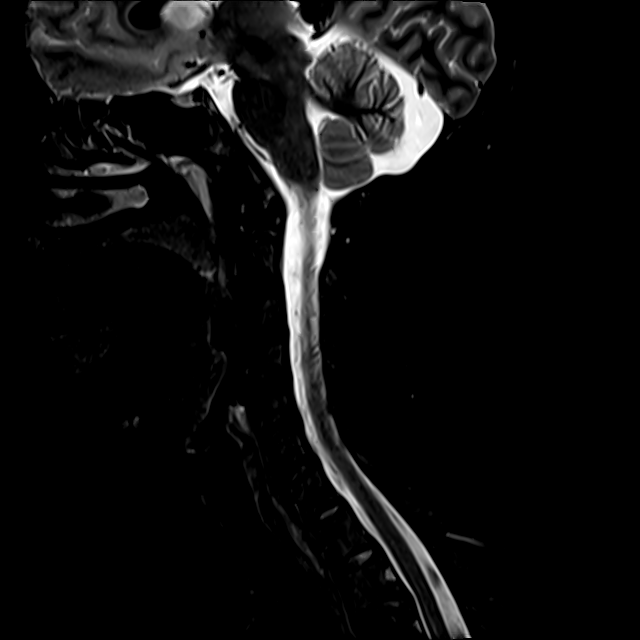
[im 12/15]
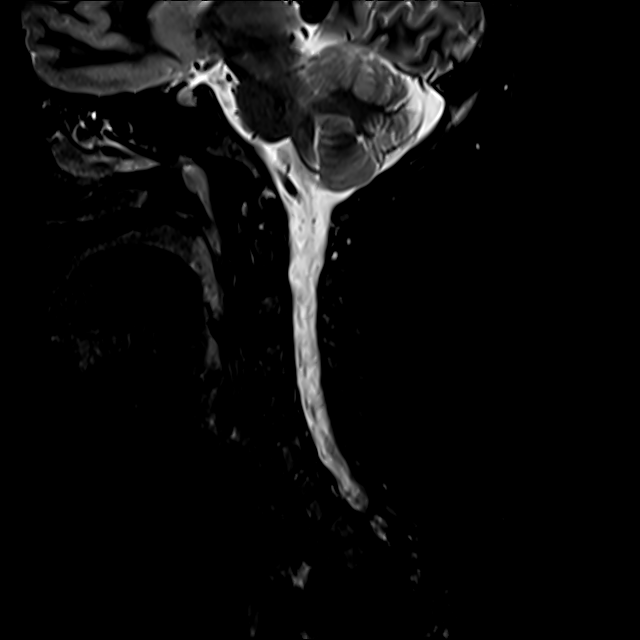

[Series 8: T2 · axial · 3.0mm · 0.50mm/px · z∈[-40,+54]mm · 9 of 30 slices shown (2 of 2)]
[im 1/30]
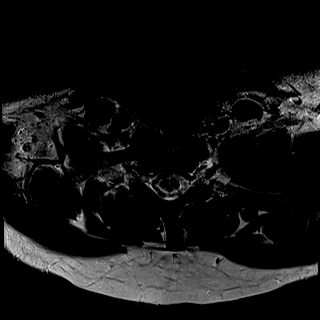
[im 5/30]
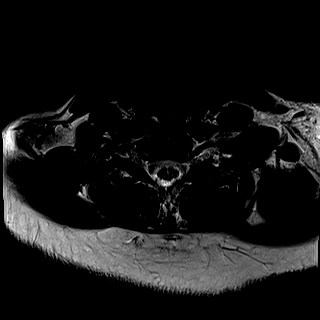
[im 10/30]
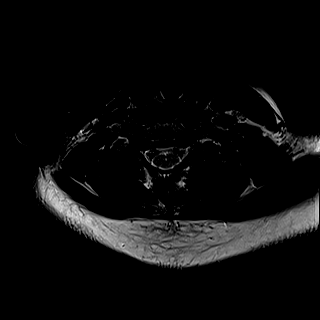
[im 13/30]
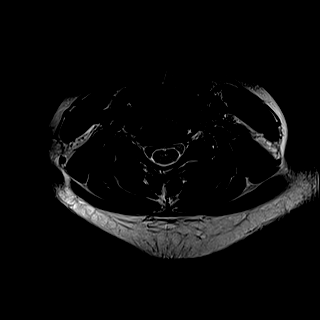
[im 15/30]
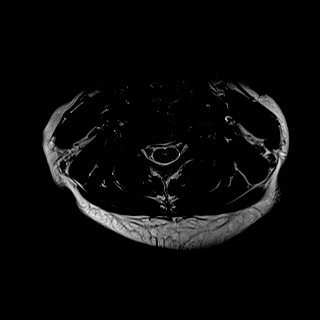
[im 17/30]
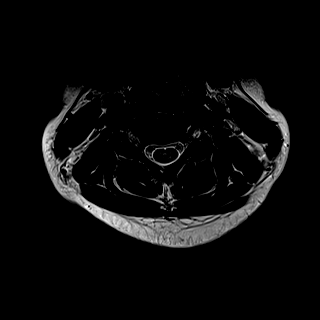
[im 20/30]
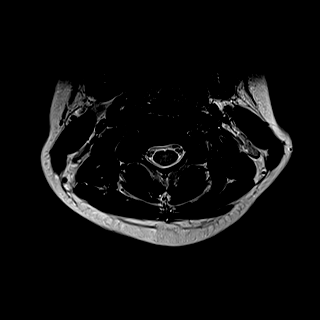
[im 25/30]
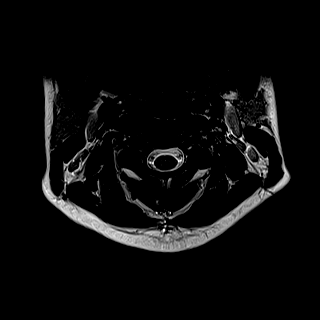
[im 30/30]
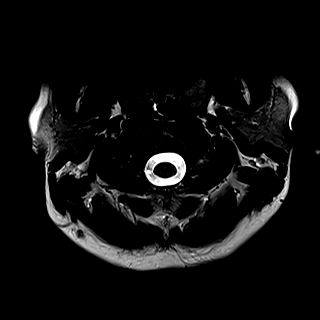

[29 of 48 positions shown; findings below may reference images not displayed]

FINDINGS: Alignment: Physiologic with preservation of the normal cervical
lordosis. No listhesis.

Vertebrae: Vertebral body height well maintained without acute or
chronic fracture. Bone marrow signal intensity within normal limits.
No discrete or worrisome osseous lesions or abnormal marrow edema.

Cord: Normal signal and morphology.

Posterior Fossa, vertebral arteries, paraspinal tissues:
Unremarkable.

Disc levels:

C2-C3: Unremarkable.

C3-C4:  Unremarkable.

C4-C5: Tiny right paracentral disc protrusion minimally indents the
ventral thecal sac (series 9, image 14). No significant spinal
stenosis or cord deformity. Foramina remain patent.

C5-C6: Tiny right paracentral disc protrusion minimally indents the
right ventral thecal sac (series 9, image 19). No significant spinal
stenosis or cord deformity. Foramina remain patent.

C6-C7:  Minimal annular bulge.  No canal or foraminal stenosis.

C7-T1:  Unremarkable.

Visualized upper thoracic spine demonstrates no significant finding.
IMPRESSION: 1. Tiny right paracentral disc protrusions at C4-5 and C5-6 without
significant stenosis or neural impingement.
2. Minimal disc bulge at C6-7 without stenosis.
3. Otherwise unremarkable and normal MRI of the cervical spine.

## 2022-04-01 MED ORDER — VENLAFAXINE HCL ER 75 MG PO CP24: 225.0000 mg | ORAL_CAPSULE | Freq: Every day | ORAL | 0 refills | Status: DC

## 2022-04-01 MED ORDER — VENLAFAXINE HCL ER 75 MG PO CP24
225.0000 mg | ORAL_CAPSULE | ORAL | Status: AC
Start: 2022-04-01 — End: 2022-04-01
  Administered 2022-04-01: 225 mg via ORAL
  Filled 2022-04-01: qty 3

## 2022-04-01 NOTE — ED Provider Notes (Addendum)
[facility] EMERGENCY DEPARTMENT Provider Note   CSN: [id number] Arrival date & time: 03/31/22  1956     History  Chief Complaint  Patient presents with   Medication Refill    Lindsey [name] is a 21 y.o. female.  The history is provided by the patient.  Medication Refill Medications/supplies requested:  Venlafaxine 225 mg capsules Reason for refill: needs prior authorization. Medications taken before: yes - see home medications   Patient has complete original prescription information: yes   Need prior approval and patient states this has not been completed by PMD.       Home Medications Prior to Admission medications   Medication Sig Start Date End Date Taking? Authorizing Provider  venlafaxine XR (EFFEXOR-XR) 75 MG 24 hr capsule Take 3 capsules (225 mg total) by mouth daily with breakfast. 04/01/22  Yes Taeden Geller, MD  azithromycin (ZITHROMAX Z-PAK) 250 MG tablet Take 2 tablets today, 1 tablet for the following 4 days 10/24/20   Wieters, Hallie C, PA-C  bisacodyl (DULCOLAX) 5 MG EC tablet Take 5 mg by mouth daily as needed for moderate constipation.    [provider]  fluconazole (DIFLUCAN) 150 MG tablet Take 1 tablet (150 mg total) by mouth daily. 03/09/22   Ivonne Andrew, NP  fluconazole (DIFLUCAN) 150 MG tablet Take 1 tablet (150 mg total) by mouth daily. 03/16/22   Ivonne Andrew, NP  insulin aspart (NOVOLOG) 100 UNIT/ML injection 100 Units See admin instructions. 05/25/21   [provider]  Insulin Human (INSULIN PUMP) SOLN Inject into the skin 3 times daily with meals, bedtime and 2 AM.    [provider]  lamoTRIgine (LAMICTAL) 100 MG tablet Take 1 tablet (100 mg total) by mouth daily. 03/09/22 06/07/22  Ivonne Andrew, NP  loratadine (CLARITIN) 10 MG tablet Take 10 mg by mouth once.    [provider]  Probiotic Product (PROBIOTIC DAILY PO) Take by mouth.    [provider]  Venlafaxine HCl 150 MG TB24 Take  1 tablet (150 mg total) by mouth daily. 03/29/22   Massie Maroon, FNP  Venlafaxine HCl 75 MG TB24 Take 1 tablet (75 mg total) by mouth daily. 03/29/22   Massie Maroon, FNP      Allergies    Bevelyn Buckles (malabar nut tree) [justicia adhatoda], Peanut-containing drug products, Cephalosporins, Penicillins, and Naproxen    Review of Systems   Review of Systems  Constitutional:  Negative for fever.  HENT:  Negative for facial swelling.   Eyes:  Negative for photophobia.  Respiratory:  Negative for wheezing and stridor.   Cardiovascular:  Negative for leg swelling.  Gastrointestinal:  Negative for abdominal pain.  Psychiatric/Behavioral:  Negative for confusion and decreased concentration.   All other systems reviewed and are negative.   Physical Exam Updated Vital Signs BP (!) 151/88   Pulse (!) 105   Temp (!) 97.4 F (36.3 C) (Oral)   Resp 16   Ht 5\' 1"  (1.549 m)   Wt 64.4 kg   SpO2 100%   BMI 26.83 kg/m  Physical Exam Vitals and nursing note reviewed.  Constitutional:      General: She is not in acute distress.    Appearance: She is well-developed.  HENT:     Head: Normocephalic and atraumatic.     Nose: Nose normal.  Eyes:     Conjunctiva/sclera: Conjunctivae normal.     Pupils: Pupils are equal, round, and reactive to light.  Comments: Normal appearance  Cardiovascular:     Rate and Rhythm: Normal rate and regular rhythm.     Pulses: Normal pulses.     Heart sounds: Normal heart sounds.  Pulmonary:     Effort: Pulmonary effort is normal. No respiratory distress.     Breath sounds: Normal breath sounds.  Abdominal:     General: Bowel sounds are normal. There is no distension.     Palpations: Abdomen is soft. There is no mass.     Tenderness: There is no abdominal tenderness. There is no guarding or rebound.  Genitourinary:    Comments: No CVA tenderness Musculoskeletal:        General: Normal range of motion.     Cervical back: Normal range of  motion.  Skin:    General: Skin is warm and dry.     Capillary Refill: Capillary refill takes less than 2 seconds.     Findings: No rash.  Neurological:     General: No focal deficit present.     Mental Status: She is alert and oriented to person, place, and time.  Psychiatric:        Mood and Affect: Affect is angry.        Behavior: Behavior is aggressive.     ED Results / Procedures / Treatments   Labs (all labs ordered are listed, but only abnormal results are displayed) Results for orders placed or performed during the hospital encounter of 03/31/22  Basic metabolic panel  Result Value Ref Range   Sodium 136 135 - 145 mmol/L   Potassium 4.2 3.5 - 5.1 mmol/L   Chloride 99 98 - 111 mmol/L   CO2 29 22 - 32 mmol/L   Glucose, Bld 258 (H) 70 - 99 mg/dL   BUN 10 6 - 20 mg/dL   Creatinine, Ser 1.28 0.44 - 1.00 mg/dL   Calcium 9.4 8.9 - 78.6 mg/dL   GFR, Estimated >76 >72 mL/min   Anion gap 8 5 - 15  CBC with Differential  Result Value Ref Range   WBC 7.9 4.0 - 10.5 K/uL   RBC 4.73 3.87 - 5.11 MIL/uL   Hemoglobin 13.7 12.0 - 15.0 g/dL   HCT 09.4 70.9 - 62.8 %   MCV 85.8 80.0 - 100.0 fL   MCH 29.0 26.0 - 34.0 pg   MCHC 33.7 30.0 - 36.0 g/dL   RDW 36.6 29.4 - 76.5 %   Platelets 325 150 - 400 K/uL   nRBC 0.0 0.0 - 0.2 %   Neutrophils Relative % 59 %   Neutro Abs 4.7 1.7 - 7.7 K/uL   Lymphocytes Relative 32 %   Lymphs Abs 2.6 0.7 - 4.0 K/uL   Monocytes Relative 7 %   Monocytes Absolute 0.5 0.1 - 1.0 K/uL   Eosinophils Relative 1 %   Eosinophils Absolute 0.1 0.0 - 0.5 K/uL   Basophils Relative 1 %   Basophils Absolute 0.1 0.0 - 0.1 K/uL   Immature Granulocytes 0 %   Abs Immature Granulocytes 0.03 0.00 - 0.07 K/uL   Arthrogram  Result Date: 03/30/2022 CLINICAL DATA:  21 year old female with chronic right shoulder pain. EXAM: SHOULDER INJECTION FOR MRI FLUOROSCOPY: Radiation exposure index: 2.2 mGy reference air kerma PROCEDURE: After a thorough discussion of risks and  benefits of the procedure, written and oral informed consent was obtained. The consent discussion included the risk of bleeding, infection and injury to nerves and adjacent blood vessels. Extra-articular injection was also a possible risk discussed.  Preliminary localization was performed over the right shoulder. The area was marked over superior medial anterior humeral head. After prep and drape in the usual sterile fashion, the skin and deeper subcutaneous tissues were anesthetized with 1% Lidocaine without Epinephrine. Under fluoroscopic guidance, a 22 gauge 3.5 inch spinal needle was advanced into the joint over the superior medial anterior humeral head using an anterior approach. Intra-articular injection of Lidocaine was performed which flowed freely and subsequently the joint was distended with 12 ml of a 1:200 dilution of Multihance contrast. The MR arthrogram solution was as follows: 10 mL Omnipaque 300 contrast agent, 0.1 mL Multihance, 10 mL saline. An end point was felt as well as the patient experiencing pressure and the injection was discontinued, the needle removed, and a sterile dressing applied. The patient was taken to MRI for subsequent imaging. The patient tolerated the procedure well and there were no complications. IMPRESSION: Successful right shoulder fluoroscopically guided injection. Electronically Signed   By: Malachy Moan M.D.   On: 03/30/2022 15:30   XR Shoulder Right  Result Date: 03/19/2022 AP axillary outlet radiographs right shoulder reviewed.  Shoulder is located.  No acute fracture.  No AC joint or glenohumeral joint arthritis.  Acromiohumeral distance maintained.  Visualized lung fields clear  XR Cervical Spine 2 or 3 views  Result Date: 03/19/2022 AP lateral radiographs cervical spine reviewed.  There is loss of lordosis.  No arthritis presents in the facet joints or between the vertebral bodies.  No acute fracture or spondylolisthesis.     EKG None  Radiology Arthrogram  Result Date: 03/30/2022 CLINICAL DATA:  21 year old female with chronic right shoulder pain. EXAM: SHOULDER INJECTION FOR MRI FLUOROSCOPY: Radiation exposure index: 2.2 mGy reference air kerma PROCEDURE: After a thorough discussion of risks and benefits of the procedure, written and oral informed consent was obtained. The consent discussion included the risk of bleeding, infection and injury to nerves and adjacent blood vessels. Extra-articular injection was also a possible risk discussed. Preliminary localization was performed over the right shoulder. The area was marked over superior medial anterior humeral head. After prep and drape in the usual sterile fashion, the skin and deeper subcutaneous tissues were anesthetized with 1% Lidocaine without Epinephrine. Under fluoroscopic guidance, a 22 gauge 3.5 inch spinal needle was advanced into the joint over the superior medial anterior humeral head using an anterior approach. Intra-articular injection of Lidocaine was performed which flowed freely and subsequently the joint was distended with 12 ml of a 1:200 dilution of Multihance contrast. The MR arthrogram solution was as follows: 10 mL Omnipaque 300 contrast agent, 0.1 mL Multihance, 10 mL saline. An end point was felt as well as the patient experiencing pressure and the injection was discontinued, the needle removed, and a sterile dressing applied. The patient was taken to MRI for subsequent imaging. The patient tolerated the procedure well and there were no complications. IMPRESSION: Successful right shoulder fluoroscopically guided injection. Electronically Signed   By: Malachy Moan M.D.   On: 03/30/2022 15:30    Procedures Procedures    Medications Ordered in ED Medications  venlafaxine XR (EFFEXOR-XR) 24 hr capsule 225 mg (has no administration in time range)    ED Course/ Medical Decision Making/ A&P                           Medical Decision  Making Needs rx for effexor 225   Amount and/or Complexity of Data Reviewed Independent Historian: friend  Details: see above External Data Reviewed: notes.    Details: previous notes reviewed: refilled medication and authorization sent Labs: ordered.    Details: Normal sodium 136 and potassium 4.2 and normal creatinine.  normal white count and hemoglobin  Risk Prescription drug management. Risk Details: From notes it is clear RX sent and authorization also sent.  I will write for 2 days until the authorization goes through.  RX for a month supply is at the pharmacy already.  PMD refilled     Final Clinical Impression(s) / ED Diagnoses Final diagnoses:  None   Return for intractable cough, coughing up blood, fevers > 100.4 unrelieved by medication, shortness of breath, intractable vomiting, chest pain, shortness of breath, weakness, numbness, changes in speech, facial asymmetry, abdominal pain, passing out, Inability to tolerate liquids or food, cough, altered mental status or any concerns. No signs of systemic illness or infection. The patient is nontoxic-appearing on exam and vital signs are within normal limits.  I have reviewed the triage vital signs and the nursing notes. Pertinent labs & imaging results that were available during my care of the patient were reviewed by me and considered in my medical decision making (see chart for details). After history, exam, and medical workup I feel the patient has been appropriately medically screened and is safe for discharge home. Pertinent diagnoses were discussed with the patient. Patient was given return precautions Rx / DC Orders ED Discharge Orders          Ordered    venlafaxine XR (EFFEXOR-XR) 75 MG 24 hr capsule  Daily with breakfast        04/01/22 0243                 Ingris Pasquarella, MD 04/01/22 7253

## 2022-04-03 NOTE — Telephone Encounter (Signed)
Has this medication been sent in yet? Thanks.

## 2022-04-04 ENCOUNTER — Telehealth: Payer: Self-pay

## 2022-04-04 NOTE — Telephone Encounter (Signed)
Can you please call patient and schedule appointment to review scan with Dr August Saucer?

## 2022-04-04 NOTE — Progress Notes (Signed)
Hi can you arrange f/u for this person thx

## 2022-04-04 NOTE — Telephone Encounter (Signed)
-----   Message from Cammy Copa, MD sent at 04/04/2022  5:45 AM EDT ----- Hi can you arrange f/u for this person thx

## 2022-04-05 ENCOUNTER — Telehealth: Payer: Self-pay

## 2022-04-05 NOTE — Telephone Encounter (Signed)
Prior authorization was denied for Venlafaxine HCl 150 MG was denied by insurance

## 2022-04-06 ENCOUNTER — Ambulatory Visit (INDEPENDENT_AMBULATORY_CARE_PROVIDER_SITE_OTHER): Payer: No Typology Code available for payment source | Admitting: Nurse Practitioner

## 2022-04-06 ENCOUNTER — Encounter: Payer: Self-pay | Admitting: Nurse Practitioner

## 2022-04-06 VITALS — BP 129/76 | HR 100 | Temp 97.8°F | Ht 61.0 in | Wt 147.6 lb

## 2022-04-06 DIAGNOSIS — F319 Bipolar disorder, unspecified: Secondary | ICD-10-CM

## 2022-04-06 DIAGNOSIS — Z0000 Encounter for general adult medical examination without abnormal findings: Secondary | ICD-10-CM

## 2022-04-06 DIAGNOSIS — B3731 Acute candidiasis of vulva and vagina: Secondary | ICD-10-CM | POA: Diagnosis not present

## 2022-04-06 DIAGNOSIS — B379 Candidiasis, unspecified: Secondary | ICD-10-CM | POA: Diagnosis not present

## 2022-04-06 LAB — POCT URINALYSIS DIP (CLINITEK)
Bilirubin, UA: NEGATIVE
Blood, UA: NEGATIVE
Glucose, UA: 500 mg/dL — AB
Ketones, POC UA: NEGATIVE mg/dL
Leukocytes, UA: NEGATIVE
Nitrite, UA: NEGATIVE
POC PROTEIN,UA: NEGATIVE
Spec Grav, UA: 1.02 (ref 1.010–1.025)
Urobilinogen, UA: 0.2 E.U./dL
pH, UA: 7 (ref 5.0–8.0)

## 2022-04-06 MED ORDER — FLUCONAZOLE 150 MG PO TABS: 150.0000 mg | ORAL_TABLET | Freq: Every day | ORAL | 0 refills | Status: DC

## 2022-04-06 MED ORDER — VENLAFAXINE HCL ER 75 MG PO CP24: 75.0000 mg | ORAL_CAPSULE | Freq: Every day | ORAL | 0 refills | Status: DC

## 2022-04-06 MED ORDER — VENLAFAXINE HCL ER 150 MG PO CP24
150.0000 mg | ORAL_CAPSULE | Freq: Every day | ORAL | 0 refills | Status: DC
Start: 2022-04-06 — End: 2022-05-30

## 2022-04-06 NOTE — Progress Notes (Signed)
@Patient  ID: Lindsey [name], female    DOB: 2001/09/30, 21 y.o.   MRN: [id number]  Chief Complaint  Patient presents with   Annual Exam    Patient is her for her annual physical without pap smear. Patient has already had her pap smear done with her ob/gyn on 01/26/22. Patient states that she thinks she may have an yeast infection; Symptom are thick vaginal discharge    Referring provider: 01/28/22, NP   HPI  Patient presents today for a physical.  Patient does have a history of type 1 diabetes and does see endocrinology for this.  She states that endocrinology also keep a check on her thyroid.  Patient has bipolar and is on Effexor.  She would like for this medication to be resent to the pharmacy in capsule form to be approved by insurance.  We discussed that we will send this in today but patient does need to establish care with a psychiatrist for medication management.  Referral has been placed.  Patient does complain today of vaginal irritation.  She does get frequent yeast infections.  We will check a vaginal swab today.  Denies f/c/s, n/v/d, hemoptysis, PND, chest pain or edema.     Allergies  Allergen Reactions   Justicia Adhatoda ([place] Nut Tree) [Justicia Adhatoda] Anaphylaxis   Peanut-Containing Drug Products Anaphylaxis   Cephalosporins Other (See Comments) and Nausea And Vomiting   Penicillins Other (See Comments) and Nausea And Vomiting   Naproxen Rash and Other (See Comments)     There is no immunization history on file for this patient.  Past Medical History:  Diagnosis Date   Diabetes mellitus without complication (HCC)     Tobacco History: Social History   Tobacco Use  Smoking Status Never  Smokeless Tobacco Never   Counseling given: Not Answered   Outpatient Encounter Medications as of 04/06/2022  Medication Sig   fluconazole (DIFLUCAN) 150 MG tablet Take 1 tablet (150 mg total) by mouth daily. Take 1 tablet today and repeat in 3 days   insulin  aspart (NOVOLOG) 100 UNIT/ML injection 100 Units See admin instructions.   Insulin Human (INSULIN PUMP) SOLN Inject into the skin 3 times daily with meals, bedtime and 2 AM.   lamoTRIgine (LAMICTAL) 100 MG tablet Take 1 tablet (100 mg total) by mouth daily.   loratadine (CLARITIN) 10 MG tablet Take 10 mg by mouth once.   Probiotic Product (PROBIOTIC DAILY PO) Take by mouth.   venlafaxine XR (EFFEXOR-XR) 150 MG 24 hr capsule Take 1 capsule (150 mg total) by mouth daily with breakfast.   venlafaxine XR (EFFEXOR-XR) 75 MG 24 hr capsule Take 3 capsules (225 mg total) by mouth daily with breakfast.   [DISCONTINUED] venlafaxine XR (EFFEXOR-XR) 75 MG 24 hr capsule Take 1 capsule (75 mg total) by mouth daily with breakfast.   bisacodyl (DULCOLAX) 5 MG EC tablet Take 5 mg by mouth daily as needed for moderate constipation.   Continuous Blood Gluc Sensor (DEXCOM G7 SENSOR) MISC CHANGE EVERY 10 DAYS (90 DAY SUPPLY)   EPINEPHrine (EPIPEN 2-PAK) 0.3 mg/0.3 mL IJ SOAJ injection EpiPen   fluconazole (DIFLUCAN) 150 MG tablet Take 1 tablet (150 mg total) by mouth daily.   [DISCONTINUED] azithromycin (ZITHROMAX Z-PAK) 250 MG tablet Take 2 tablets today, 1 tablet for the following 4 days   [DISCONTINUED] fluconazole (DIFLUCAN) 150 MG tablet Take 1 tablet (150 mg total) by mouth daily.   [DISCONTINUED] Venlafaxine HCl 150 MG TB24 Take 1 tablet (150 mg total)  by mouth daily.   [DISCONTINUED] Venlafaxine HCl 75 MG TB24 Take 1 tablet (75 mg total) by mouth daily.   No facility-administered encounter medications on file as of 04/06/2022.     Review of Systems  Review of Systems  Constitutional: Negative.   HENT: Negative.    Cardiovascular: Negative.   Gastrointestinal: Negative.   Allergic/Immunologic: Negative.   Neurological: Negative.   Psychiatric/Behavioral: Negative.         Physical Exam  BP 129/76   Pulse 100   Temp 97.8 F (36.6 C)   Ht 5\' 1"  (1.549 m)   Wt 147 lb 9.6 oz (67 kg)   SpO2  100%   BMI 27.89 kg/m   Wt Readings from Last 5 Encounters:  04/06/22 147 lb 9.6 oz (67 kg)  03/31/22 142 lb (64.4 kg)  03/09/22 142 lb 1.6 oz (64.5 kg)     Physical Exam Vitals and nursing note reviewed.  Constitutional:      General: She is not in acute distress.    Appearance: She is well-developed.  Cardiovascular:     Rate and Rhythm: Normal rate and regular rhythm.  Pulmonary:     Effort: Pulmonary effort is normal.     Breath sounds: Normal breath sounds.  Neurological:     Mental Status: She is alert and oriented to person, place, and time.      Lab Results:  CBC    Component Value Date/Time   WBC 7.9 03/31/2022 2127   RBC 4.73 03/31/2022 2127   HGB 13.7 03/31/2022 2127   HCT 40.6 03/31/2022 2127   PLT 325 03/31/2022 2127   MCV 85.8 03/31/2022 2127   MCH 29.0 03/31/2022 2127   MCHC 33.7 03/31/2022 2127   RDW 11.8 03/31/2022 2127   LYMPHSABS 2.6 03/31/2022 2127   MONOABS 0.5 03/31/2022 2127   EOSABS 0.1 03/31/2022 2127   BASOSABS 0.1 03/31/2022 2127    BMET    Component Value Date/Time   NA 136 03/31/2022 2127   K 4.2 03/31/2022 2127   CL 99 03/31/2022 2127   CO2 29 03/31/2022 2127   GLUCOSE 258 (H) 03/31/2022 2127   BUN 10 03/31/2022 2127   CREATININE 0.71 03/31/2022 2127   CALCIUM 9.4 03/31/2022 2127   GFRNONAA >60 03/31/2022 2127    BNP No results found for: "BNP"  ProBNP No results found for: "PROBNP"  Imaging: MR Cervical Spine w/o contrast  Result Date: 04/02/2022 CLINICAL DATA:  Initial evaluation for right radicular arm pain. EXAM: MRI CERVICAL SPINE WITHOUT CONTRAST TECHNIQUE: Multiplanar, multisequence MR imaging of the cervical spine was performed. No intravenous contrast was administered. COMPARISON:  Radiograph from 03/16/2022. FINDINGS: Alignment: Physiologic with preservation of the normal cervical lordosis. No listhesis. Vertebrae: Vertebral body height well maintained without acute or chronic fracture. Bone marrow signal  intensity within normal limits. No discrete or worrisome osseous lesions or abnormal marrow edema. Cord: Normal signal and morphology. Posterior Fossa, vertebral arteries, paraspinal tissues: Unremarkable. Disc levels: C2-C3: Unremarkable. C3-C4:  Unremarkable. C4-C5: Tiny right paracentral disc protrusion minimally indents the ventral thecal sac (series 9, image 14). No significant spinal stenosis or cord deformity. Foramina remain patent. C5-C6: Tiny right paracentral disc protrusion minimally indents the right ventral thecal sac (series 9, image 19). No significant spinal stenosis or cord deformity. Foramina remain patent. C6-C7:  Minimal annular bulge.  No canal or foraminal stenosis. C7-T1:  Unremarkable. Visualized upper thoracic spine demonstrates no significant finding. IMPRESSION: 1. Tiny right paracentral disc protrusions at C4-5  and C5-6 without significant stenosis or neural impingement. 2. Minimal disc bulge at C6-7 without stenosis. 3. Otherwise unremarkable and normal MRI of the cervical spine. Electronically Signed   By: Rise Mu M.D.   On: 04/02/2022 01:14   MR SHOULDER RIGHT W CONTRAST  Result Date: 04/01/2022 CLINICAL DATA:  Right shoulder pain for 9 years EXAM: MRI OF THE RIGHT SHOULDER WITH CONTRAST TECHNIQUE: Multiplanar, multisequence MR imaging of the right shoulder was performed following the administration of intra-articular contrast. CONTRAST:  See Injection Documentation. COMPARISON:  None Available. FINDINGS: Rotator cuff: Mild tendinosis of the supraspinatus and infraspinatus tendons. Infraspinatus tendon is intact. Teres minor tendon is intact. Subscapularis tendon is intact. Muscles: No muscle atrophy or edema. No intramuscular fluid collection or hematoma. Biceps Long Head: Intraarticular and extraarticular portions of the biceps tendon are intact. Acromioclavicular Joint: No significant arthropathy of the acromioclavicular joint. No subacromial/subdeltoid bursal  fluid. Glenohumeral Joint: Intraarticular contrast distending the joint capsule. Normal glenohumeral ligaments. No chondral defect. Labrum: Tiny anterior labral tear seen on a single image (image 8/series 11). Otherwise no significant labral tear. Bones: No fracture or dislocation. No aggressive osseous lesion. Other: No fluid collection or hematoma. IMPRESSION: 1. Mild tendinosis of the supraspinatus and infraspinatus tendons. 2. Tiny anterior labral tear seen on a single image. Otherwise no significant labral tear. Electronically Signed   By: Elige Ko M.D.   On: 04/01/2022 08:41   Arthrogram  Result Date: 03/30/2022 CLINICAL DATA:  21 year old female with chronic right shoulder pain. EXAM: SHOULDER INJECTION FOR MRI FLUOROSCOPY: Radiation exposure index: 2.2 mGy reference air kerma PROCEDURE: After a thorough discussion of risks and benefits of the procedure, written and oral informed consent was obtained. The consent discussion included the risk of bleeding, infection and injury to nerves and adjacent blood vessels. Extra-articular injection was also a possible risk discussed. Preliminary localization was performed over the right shoulder. The area was marked over superior medial anterior humeral head. After prep and drape in the usual sterile fashion, the skin and deeper subcutaneous tissues were anesthetized with 1% Lidocaine without Epinephrine. Under fluoroscopic guidance, a 22 gauge 3.5 inch spinal needle was advanced into the joint over the superior medial anterior humeral head using an anterior approach. Intra-articular injection of Lidocaine was performed which flowed freely and subsequently the joint was distended with 12 ml of a 1:200 dilution of Multihance contrast. The MR arthrogram solution was as follows: 10 mL Omnipaque 300 contrast agent, 0.1 mL Multihance, 10 mL saline. An end point was felt as well as the patient experiencing pressure and the injection was discontinued, the needle  removed, and a sterile dressing applied. The patient was taken to MRI for subsequent imaging. The patient tolerated the procedure well and there were no complications. IMPRESSION: Successful right shoulder fluoroscopically guided injection. Electronically Signed   By: Malachy Moan M.D.   On: 03/30/2022 15:30   XR Shoulder Right  Result Date: 03/19/2022 AP axillary outlet radiographs right shoulder reviewed.  Shoulder is located.  No acute fracture.  No AC joint or glenohumeral joint arthritis.  Acromiohumeral distance maintained.  Visualized lung fields clear  XR Cervical Spine 2 or 3 views  Result Date: 03/19/2022 AP lateral radiographs cervical spine reviewed.  There is loss of lordosis.  No arthritis presents in the facet joints or between the vertebral bodies.  No acute fracture or spondylolisthesis.    Assessment & Plan:   Yeast infection - POCT URINALYSIS DIP (CLINITEK) - NuSwab Vaginitis Plus (VG+)  2. Bipolar 1 disorder (HCC)  - venlafaxine XR (EFFEXOR-XR) 150 MG 24 hr capsule; Take 1 capsule (150 mg total) by mouth daily with breakfast.  Dispense: 30 capsule; Refill: 0  Please follow up with psychiatry as soon as possible for medication management  3. Routine adult health maintenance  - Lipid Panel  4. Vaginal yeast infection  - fluconazole (DIFLUCAN) 150 MG tablet; Take 1 tablet (150 mg total) by mouth daily. Take 1 tablet today and repeat in 3 days  Dispense: 2 tablet; Refill: 0   Follow up:  Follow up in 1 year     Ivonne Andrew, NP 04/06/2022

## 2022-04-06 NOTE — Patient Instructions (Signed)
1. Yeast infection  - POCT URINALYSIS DIP (CLINITEK) - NuSwab Vaginitis Plus (VG+)  2. Bipolar 1 disorder (HCC)  - venlafaxine XR (EFFEXOR-XR) 150 MG 24 hr capsule; Take 1 capsule (150 mg total) by mouth daily with breakfast.  Dispense: 30 capsule; Refill: 0  Please follow up with psychiatry as soon as possible for medication management  3. Routine adult health maintenance  - Lipid Panel  4. Vaginal yeast infection  - fluconazole (DIFLUCAN) 150 MG tablet; Take 1 tablet (150 mg total) by mouth daily. Take 1 tablet today and repeat in 3 days  Dispense: 2 tablet; Refill: 0   Follow up:  Follow up in 1 year  Vaginal Yeast Infection, Adult  Vaginal yeast infection is a condition that causes vaginal discharge as well as soreness, swelling, and redness (inflammation) of the vagina. This is a common condition. Some women get this infection frequently. What are the causes? This condition is caused by a change in the normal balance of the yeast (Candida) and normal bacteria that live in the vagina. This change causes an overgrowth of yeast, which causes the inflammation. What increases the risk? The condition is more likely to develop in women who: Take antibiotic medicines. Have diabetes. Take birth control pills. Are pregnant. Douche often. Have a weak body defense system (immune system). Have been taking steroid medicines for a long time. Frequently wear tight clothing. What are the signs or symptoms? Symptoms of this condition include: White, thick, creamy vaginal discharge. Swelling, itching, redness, and irritation of the vagina. The lips of the vagina (labia) may be affected as well. Pain or a burning feeling while urinating. Pain during sex. How is this diagnosed? This condition is diagnosed based on: Your medical history. A physical exam. A pelvic exam. Your health care provider will examine a sample of your vaginal discharge under a microscope. Your health care  provider may send this sample for testing to confirm the diagnosis. How is this treated? This condition is treated with medicine. Medicines may be over-the-counter or prescription. You may be told to use one or more of the following: Medicine that is taken by mouth (orally). Medicine that is applied as a cream (topically). Medicine that is inserted directly into the vagina (suppository). Follow these instructions at home: Take or apply over-the-counter and prescription medicines only as told by your health care provider. Do not use tampons until your health care provider approves. Do not have sex until your infection has cleared. Sex can prolong or worsen your symptoms of infection. Ask your health care provider when it is safe to resume sexual activity. Keep all follow-up visits. This is important. How is this prevented?  Do not wear tight clothes, such as pantyhose or tight pants. Wear breathable cotton underwear. Do not use douches, perfumed soap, creams, or powders. Wipe from front to back after using the toilet. If you have diabetes, keep your blood sugar levels under control. Ask your health care provider for other ways to prevent yeast infections. Contact a health care provider if: You have a fever. Your symptoms go away and then return. Your symptoms do not get better with treatment. Your symptoms get worse. You have new symptoms. You develop blisters in or around your vagina. You have blood coming from your vagina and it is not your menstrual period. You develop pain in your abdomen. Summary Vaginal yeast infection is a condition that causes discharge as well as soreness, swelling, and redness (inflammation) of the vagina. This condition is  treated with medicine. Medicines may be over-the-counter or prescription. Take or apply over-the-counter and prescription medicines only as told by your health care provider. Do not douche. Resume sexual activity or use of tampons as  instructed by your health care provider. Contact a health care provider if your symptoms do not get better with treatment or your symptoms go away and then return. This information is not intended to replace advice given to you by your health care provider. Make sure you discuss any questions you have with your health care provider. Document Revised: 12/20/2020 Document Reviewed: 12/20/2020 Elsevier Patient Education  2023 [employer]   Health Maintenance, Female Adopting a healthy lifestyle and getting preventive care are important in promoting health and wellness. Ask your health care provider about: The right schedule for you to have regular tests and exams. Things you can do on your own to prevent diseases and keep yourself healthy. What should I know about diet, weight, and exercise? Eat a healthy diet  Eat a diet that includes plenty of vegetables, fruits, low-fat dairy products, and lean protein. Do not eat a lot of foods that are high in solid fats, added sugars, or sodium. Maintain a healthy weight Body mass index (BMI) is used to identify weight problems. It estimates body fat based on height and weight. Your health care provider can help determine your BMI and help you achieve or maintain a healthy weight. Get regular exercise Get regular exercise. This is one of the most important things you can do for your health. Most adults should: Exercise for at least 150 minutes each week. The exercise should increase your heart rate and make you sweat (moderate-intensity exercise). Do strengthening exercises at least twice a week. This is in addition to the moderate-intensity exercise. Spend less time sitting. Even light physical activity can be beneficial. Watch cholesterol and blood lipids Have your blood tested for lipids and cholesterol at 22 years of age, then have this test every 5 years. Have your cholesterol levels checked more often if: Your lipid or cholesterol levels are  high. You are older than 22 years of age. You are at high risk for heart disease. What should I know about cancer screening? Depending on your health history and family history, you may need to have cancer screening at various ages. This may include screening for: Breast cancer. Cervical cancer. Colorectal cancer. Skin cancer. Lung cancer. What should I know about heart disease, diabetes, and high blood pressure? Blood pressure and heart disease High blood pressure causes heart disease and increases the risk of stroke. This is more likely to develop in people who have high blood pressure readings or are overweight. Have your blood pressure checked: Every 3-5 years if you are 79-28 years of age. Every year if you are 41 years old or older. Diabetes Have regular diabetes screenings. This checks your fasting blood sugar level. Have the screening done: Once every three years after age 40 if you are at a normal weight and have a low risk for diabetes. More often and at a younger age if you are overweight or have a high risk for diabetes. What should I know about preventing infection? Hepatitis B If you have a higher risk for hepatitis B, you should be screened for this virus. Talk with your health care provider to find out if you are at risk for hepatitis B infection. Hepatitis C Testing is recommended for: Everyone born from [birth year] through 1966. Anyone with known risk factors for hepatitis  C. Sexually transmitted infections (STIs) Get screened for STIs, including gonorrhea and chlamydia, if: You are sexually active and are younger than 22 years of age. You are older than 22 years of age and your health care provider tells you that you are at risk for this type of infection. Your sexual activity has changed since you were last screened, and you are at increased risk for chlamydia or gonorrhea. Ask your health care provider if you are at risk. Ask your health care provider about whether you  are at high risk for HIV. Your health care provider may recommend a prescription medicine to help prevent HIV infection. If you choose to take medicine to prevent HIV, you should first get tested for HIV. You should then be tested every 3 months for as long as you are taking the medicine. Pregnancy If you are about to stop having your period (premenopausal) and you may become pregnant, seek counseling before you get pregnant. Take 400 to 800 micrograms (mcg) of folic acid every day if you become pregnant. Ask for birth control (contraception) if you want to prevent pregnancy. Osteoporosis and menopause Osteoporosis is a disease in which the bones lose minerals and strength with aging. This can result in bone fractures. If you are 63 years old or older, or if you are at risk for osteoporosis and fractures, ask your health care provider if you should: Be screened for bone loss. Take a calcium or vitamin D supplement to lower your risk of fractures. Be given hormone replacement therapy (HRT) to treat symptoms of menopause. Follow these instructions at home: Alcohol use Do not drink alcohol if: Your health care provider tells you not to drink. You are pregnant, may be pregnant, or are planning to become pregnant. If you drink alcohol: Limit how much you have to: 0-1 drink a day. Know how much alcohol is in your drink. In the [place] one drink equals one 12 oz bottle of beer (355 mL), one 5 oz glass of wine (148 mL), or one 1 oz glass of hard liquor (44 mL). Lifestyle Do not use any products that contain nicotine or tobacco. These products include cigarettes, chewing tobacco, and vaping devices, such as e-cigarettes. If you need help quitting, ask your health care provider. Do not use street drugs. Do not share needles. Ask your health care provider for help if you need support or information about quitting drugs. General instructions Schedule regular health, dental, and eye exams. Stay current  with your vaccines. Tell your health care provider if: You often feel depressed. You have ever been abused or do not feel safe at home. Summary Adopting a healthy lifestyle and getting preventive care are important in promoting health and wellness. Follow your health care provider's instructions about healthy diet, exercising, and getting tested or screened for diseases. Follow your health care provider's instructions on monitoring your cholesterol and blood pressure. This information is not intended to replace advice given to you by your health care provider. Make sure you discuss any questions you have with your health care provider. Document Revised: 02/21/2021 Document Reviewed: 02/21/2021 Elsevier Patient Education  2023 [employer].

## 2022-04-06 NOTE — Assessment & Plan Note (Signed)
-   POCT URINALYSIS DIP (CLINITEK) - NuSwab Vaginitis Plus (VG+)  2. Bipolar 1 disorder (HCC)  - venlafaxine XR (EFFEXOR-XR) 150 MG 24 hr capsule; Take 1 capsule (150 mg total) by mouth daily with breakfast.  Dispense: 30 capsule; Refill: 0  Please follow up with psychiatry as soon as possible for medication management  3. Routine adult health maintenance  - Lipid Panel  4. Vaginal yeast infection  - fluconazole (DIFLUCAN) 150 MG tablet; Take 1 tablet (150 mg total) by mouth daily. Take 1 tablet today and repeat in 3 days  Dispense: 2 tablet; Refill: 0   Follow up:  Follow up in 1 year

## 2022-04-07 ENCOUNTER — Telehealth: Payer: Self-pay | Admitting: Orthopedic Surgery

## 2022-04-07 LAB — LIPID PANEL
Chol/HDL Ratio: 2.5 ratio (ref 0.0–4.4)
Cholesterol, Total: 150 mg/dL (ref 100–199)
HDL: 59 mg/dL (ref >39)
LDL Chol Calc (NIH): 84 mg/dL (ref 0–99)
Triglycerides: 28 mg/dL (ref 0–149)
VLDL Cholesterol Cal: 7 mg/dL (ref 5–40)

## 2022-04-10 LAB — NUSWAB VAGINITIS PLUS (VG+)
Candida albicans, NAA: POSITIVE — AB
Candida glabrata, NAA: NEGATIVE
Chlamydia trachomatis, NAA: NEGATIVE
Neisseria gonorrhoeae, NAA: NEGATIVE
Trich vag by NAA: NEGATIVE

## 2022-05-03 ENCOUNTER — Ambulatory Visit (INDEPENDENT_AMBULATORY_CARE_PROVIDER_SITE_OTHER): Payer: No Typology Code available for payment source | Admitting: Surgical

## 2022-05-03 DIAGNOSIS — R200 Anesthesia of skin: Secondary | ICD-10-CM | POA: Diagnosis not present

## 2022-05-08 ENCOUNTER — Encounter: Payer: Self-pay | Admitting: Orthopedic Surgery

## 2022-05-08 NOTE — Progress Notes (Signed)
Office Visit Note   Patient: Lindsey [name]           Date of Birth: 13-Mar-2001           MRN: [id number] Visit Date: 05/03/2022 Requested by: Ivonne Andrew, NP [address] [place],  [place] [address] PCP: Ivonne Andrew, NP  Subjective: Chief Complaint  Patient presents with   Other    Review shoulder and cervical MRI scans    HPI: Lindsey [name] is a 21 y.o. female who presents to the office for MRI review. Patient denies any changes in symptoms.  Continues to complain mainly of bilateral arm numbness and tingling.  Numbness wakes her up at night with symptoms where her entire arm will be numb.  She notices the symptoms occasionally throughout the day but mostly at night.  Right arm bothers her more commonly than the left arm.  This has been going on for 4 to 5 months.  She also notes a general achiness in the right shoulder.  She is very active in high school doing water polo and wrestling.  She has been doing physical therapy exercises at home and at the gym but she states that she is not the best about keeping compliant with continually going to exercise.  She works at a [employer].  Denies any instability sensation.  Also reports pain in the medial border of the right scapula.  No vision changes, headache, family history of neurologic disorders.  She does have history of bipolar disorder and type 1 diabetes.              ROS: All systems reviewed are negative as they relate to the chief complaint within the history of present illness.  Patient denies fevers or chills.  Assessment & Plan: Visit Diagnoses:  1. Arm numbness     Plan: Lindsey [name] is a 21 y.o. female who presents to the office for review of MRI arthrogram of the right shoulder and MRI of the cervical spine.  MRI of the cervical spine did demonstrate very small paracentral disc protrusion at C4-C5 and C5-C6 without any significant stenosis.  Do not anticipate this is causing both arms to continually go numb.  MRI  arthrogram of the right shoulder demonstrates labral tear seen on 1 image and tendinosis of the supraspinatus and infraspinatus tendons mildly.  Both MRIs are inconclusive as far as explaining patient's bilateral arm paresthesias.  After discussion of next options, patient would like to proceed with bilateral nerve conduction study of the upper extremities.  Follow-up after studies to review results with Dr. August Saucer.  Follow-Up Instructions: No follow-ups on file.   Orders:  Orders Placed This Encounter  Procedures   Ambulatory referral to Physical Medicine Rehab   No orders of the defined types were placed in this encounter.     Procedures: No procedures performed   Clinical Data: No additional findings.  Objective: Vital Signs: There were no vitals taken for this visit.  Physical Exam:  Constitutional: Patient appears well-developed HEENT:  Head: Normocephalic Eyes:EOM are normal Neck: Normal range of motion Cardiovascular: Normal rate Pulmonary/chest: Effort normal Neurologic: Patient is alert Skin: Skin is warm Psychiatric: Patient has normal mood and affect  Ortho Exam: Ortho exam demonstrates right shoulder with equivalent passive and active range of motion to the left shoulder with no significant stiffness.  No coarseness or grinding noted with passive motion of either shoulder.  Negative apprehension sign.  Mild tenderness over the bicipital  groove.  No tenderness over the [facility] joint.  5/5 motor strength of bilateral grip strength, finger abduction, pronation/supination, bicep, tricep, deltoid.  Excellent rotator cuff strength of supra, infra, subscap of bilateral shoulders rated 5/5.  No atrophy noted of either upper extremity.  Specialty Comments:  No specialty comments available.  Imaging: No results found.   PMFS History: Patient Active Problem List   Diagnosis Date Noted   Yeast infection 04/06/2022   Bipolar 1 disorder (HCC) 03/09/2022   Past Medical  History:  Diagnosis Date   Diabetes mellitus without complication (HCC)     Family History  Problem Relation Age of Onset   Depression Mother    Cancer Maternal Aunt    Stroke Paternal Aunt    Heart disease Maternal Grandmother    Diabetes Maternal Grandfather    Heart disease Paternal Grandmother    Stroke Paternal Grandfather     No past surgical history on file. Social History   Occupational History   Not on file  Tobacco Use   Smoking status: Never   Smokeless tobacco: Never  Vaping Use   Vaping Use: Some days  Substance and Sexual Activity   Alcohol use: Yes    Comment: occ   Drug use: Yes    Types: Marijuana   Sexual activity: Yes

## 2022-05-17 NOTE — Telephone Encounter (Signed)
Darl Pikes, could you please advise on this? Thanks.

## 2022-05-30 ENCOUNTER — Other Ambulatory Visit: Payer: Self-pay

## 2022-05-30 DIAGNOSIS — F319 Bipolar disorder, unspecified: Secondary | ICD-10-CM

## 2022-05-31 ENCOUNTER — Other Ambulatory Visit: Payer: Self-pay | Admitting: Nurse Practitioner

## 2022-05-31 DIAGNOSIS — F319 Bipolar disorder, unspecified: Secondary | ICD-10-CM

## 2022-05-31 MED ORDER — VENLAFAXINE HCL ER 150 MG PO CP24: 150.0000 mg | ORAL_CAPSULE | Freq: Every day | ORAL | 0 refills | Status: DC

## 2022-05-31 MED ORDER — LAMOTRIGINE 100 MG PO TABS: 100.0000 mg | ORAL_TABLET | Freq: Every day | ORAL | 2 refills | Status: DC

## 2022-05-31 MED ORDER — VENLAFAXINE HCL ER 75 MG PO CP24
225.0000 mg | ORAL_CAPSULE | Freq: Every day | ORAL | 0 refills | Status: DC
Start: 2022-05-31 — End: 2022-06-13

## 2022-05-31 NOTE — Telephone Encounter (Signed)
I just placed it for psychiatry referral

## 2022-05-31 NOTE — Telephone Encounter (Signed)
Referral placed.

## 2022-06-02 ENCOUNTER — Encounter (HOSPITAL_BASED_OUTPATIENT_CLINIC_OR_DEPARTMENT_OTHER): Payer: Self-pay | Admitting: Obstetrics and Gynecology

## 2022-06-02 ENCOUNTER — Emergency Department (HOSPITAL_BASED_OUTPATIENT_CLINIC_OR_DEPARTMENT_OTHER)
Admission: EM | Admit: 2022-06-02 | Discharge: 2022-06-02 | Disposition: A | Discharge status: Home / Self Care | Payer: No Typology Code available for payment source | Attending: Emergency Medicine | Admitting: Emergency Medicine

## 2022-06-02 ENCOUNTER — Other Ambulatory Visit: Payer: Self-pay

## 2022-06-02 DIAGNOSIS — Z794 Long term (current) use of insulin: Secondary | ICD-10-CM | POA: Insufficient documentation

## 2022-06-02 DIAGNOSIS — T782XXA Anaphylactic shock, unspecified, initial encounter: Secondary | ICD-10-CM | POA: Diagnosis present

## 2022-06-02 DIAGNOSIS — Z91010 Allergy to peanuts: Secondary | ICD-10-CM | POA: Diagnosis not present

## 2022-06-02 DIAGNOSIS — E109 Type 1 diabetes mellitus without complications: Secondary | ICD-10-CM | POA: Insufficient documentation

## 2022-06-02 LAB — COMPREHENSIVE METABOLIC PANEL
ALT: 10 U/L (ref 0–44)
AST: 9 U/L — ABNORMAL LOW (ref 15–41)
Albumin: 4.6 g/dL (ref 3.5–5.0)
Alkaline Phosphatase: 67 U/L (ref 38–126)
Anion gap: 12 (ref 5–15)
BUN: 11 mg/dL (ref 6–20)
CO2: 26 mmol/L (ref 22–32)
Calcium: 9.2 mg/dL (ref 8.9–10.3)
Chloride: 98 mmol/L (ref 98–111)
Creatinine, Ser: 0.71 mg/dL (ref 0.44–1.00)
GFR, Estimated: >60 mL/min (ref >60)
Glucose, Bld: 155 mg/dL — ABNORMAL HIGH (ref 70–99)
Potassium: 3.4 mmol/L — ABNORMAL LOW (ref 3.5–5.1)
Sodium: 136 mmol/L (ref 135–145)
Total Bilirubin: 0.5 mg/dL (ref 0.3–1.2)
Total Protein: 7.5 g/dL (ref 6.5–8.1)

## 2022-06-02 LAB — CBC WITH DIFFERENTIAL/PLATELET
Abs Immature Granulocytes: 0.02 10*3/uL (ref 0.00–0.07)
Basophils Absolute: 0 10*3/uL (ref 0.0–0.1)
Basophils Relative: 0 %
Eosinophils Absolute: 0 10*3/uL (ref 0.0–0.5)
Eosinophils Relative: 1 %
HCT: 43.6 % (ref 36.0–46.0)
Hemoglobin: 15 g/dL (ref 12.0–15.0)
Immature Granulocytes: 0 %
Lymphocytes Relative: 54 %
Lymphs Abs: 4.1 10*3/uL — ABNORMAL HIGH (ref 0.7–4.0)
MCH: 28.9 pg (ref 26.0–34.0)
MCHC: 34.4 g/dL (ref 30.0–36.0)
MCV: 84 fL (ref 80.0–100.0)
Monocytes Absolute: 0.4 10*3/uL (ref 0.1–1.0)
Monocytes Relative: 5 %
Neutro Abs: 3 10*3/uL (ref 1.7–7.7)
Neutrophils Relative %: 40 %
Platelets: 419 10*3/uL — ABNORMAL HIGH (ref 150–400)
RBC: 5.19 MIL/uL — ABNORMAL HIGH (ref 3.87–5.11)
RDW: 11.8 % (ref 11.5–15.5)
WBC: 7.6 10*3/uL (ref 4.0–10.5)
nRBC: 0 % (ref 0.0–0.2)

## 2022-06-02 LAB — CBG MONITORING, ED: Glucose-Capillary: 257 mg/dL — ABNORMAL HIGH (ref 70–99)

## 2022-06-02 LAB — PREGNANCY, URINE: Preg Test, Ur: NEGATIVE

## 2022-06-02 LAB — HCG, SERUM, QUALITATIVE: Preg, Serum: NEGATIVE

## 2022-06-02 MED ORDER — ONDANSETRON HCL 4 MG/2ML IJ SOLN
4.0000 mg | Freq: Once | INTRAMUSCULAR | Status: AC
  Administered 2022-06-02: 4 mg via INTRAVENOUS
  Filled 2022-06-02: qty 2

## 2022-06-02 MED ORDER — METHYLPREDNISOLONE SODIUM SUCC 125 MG IJ SOLR
125.0000 mg | Freq: Once | INTRAMUSCULAR | Status: AC
  Administered 2022-06-02: 125 mg via INTRAVENOUS
  Filled 2022-06-02: qty 2

## 2022-06-02 MED ORDER — ALBUTEROL SULFATE (2.5 MG/3ML) 0.083% IN NEBU
2.5000 mg | INHALATION_SOLUTION | Freq: Once | RESPIRATORY_TRACT | Status: AC
  Administered 2022-06-02: 2.5 mg via RESPIRATORY_TRACT

## 2022-06-02 MED ORDER — FAMOTIDINE IN NACL 20-0.9 MG/50ML-% IV SOLN
20.0000 mg | Freq: Once | INTRAVENOUS | Status: AC
  Administered 2022-06-02: 20 mg via INTRAVENOUS
  Filled 2022-06-02: qty 50

## 2022-06-02 MED ORDER — PREDNISONE 20 MG PO TABS: 20.0000 mg | ORAL_TABLET | Freq: Every day | ORAL | 0 refills | Status: AC

## 2022-06-02 MED ORDER — EPINEPHRINE 0.3 MG/0.3ML IJ SOAJ: 0.3000 mg | INTRAMUSCULAR | 0 refills | Status: AC | PRN

## 2022-06-02 MED ORDER — ALBUTEROL SULFATE (2.5 MG/3ML) 0.083% IN NEBU
INHALATION_SOLUTION | RESPIRATORY_TRACT | Status: AC
  Filled 2022-06-02: qty 3

## 2022-06-02 MED ORDER — SODIUM CHLORIDE 0.9 % IV BOLUS
1000.0000 mL | Freq: Once | INTRAVENOUS | Status: AC
  Administered 2022-06-02: 1000 mL via INTRAVENOUS

## 2022-06-02 MED ORDER — EPINEPHRINE 0.3 MG/0.3ML IJ SOAJ
0.3000 mg | Freq: Once | INTRAMUSCULAR | Status: AC
  Administered 2022-06-02: 0.3 mg via INTRAMUSCULAR

## 2022-06-02 NOTE — Discharge Instructions (Addendum)
Take the steroids as prescribed  I have written for an EpiPen prescription, use as needed

## 2022-06-02 NOTE — ED Notes (Signed)
Pt moved down to 2L

## 2022-06-02 NOTE — ED Provider Notes (Signed)
[facility] EMERGENCY DEPT Provider Note   CSN: [id number] Arrival date & time: 06/02/22  1250     History  Chief Complaint  Patient presents with   Allergic Reaction    Lindsey [name] is a 21 y.o. 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  Tried a new frozen meal at [employer].  Began feeling short of breath.  Realized that the meal she was eating had cashews in it.  States she has needed epinephrine previously.  Took 50 mg Benadryl then had episode of emesis took 25 mg in addition.  She feels itchy this feels feels like her throat is going to close up.  She speaks in full sentences.  Has some splotchy urticaria.  No stridor.  She drove here via POV.  HPI     Home Medications Prior to Admission medications   Medication Sig Start Date End Date Taking? Authorizing Provider  EPINEPHrine 0.3 mg/0.3 mL IJ SOAJ injection Inject 0.3 mg into the muscle as needed for anaphylaxis. 06/02/22  Yes Tanette Chauca A, PA-C  predniSONE (DELTASONE) 20 MG tablet Take 1 tablet (20 mg total) by mouth daily for 5 days. 06/02/22 06/07/22 Yes Kimmie Berggren A, PA-C  bisacodyl (DULCOLAX) 5 MG EC tablet Take 5 mg by mouth daily as needed for moderate constipation.    [provider]  Continuous Blood Gluc Sensor (DEXCOM G7 SENSOR) MISC CHANGE EVERY 10 DAYS (90 DAY SUPPLY) 03/10/22   [provider]  fluconazole (DIFLUCAN) 150 MG tablet Take 1 tablet (150 mg total) by mouth daily. 03/09/22   Ivonne Andrew, NP  fluconazole (DIFLUCAN) 150 MG tablet Take 1 tablet (150 mg total) by mouth daily. Take 1 tablet today and repeat in 3 days 04/06/22   Ivonne Andrew, NP  insulin aspart (NOVOLOG) 100 UNIT/ML injection 100 Units See admin instructions. 05/25/21   [provider]  Insulin Human (INSULIN PUMP) SOLN Inject into the skin 3 times  daily with meals, bedtime and 2 AM.    [provider]  lamoTRIgine (LAMICTAL) 100 MG tablet Take 1 tablet (100 mg total) by mouth daily. 05/31/22 08/29/22  Ivonne Andrew, NP  loratadine (CLARITIN) 10 MG tablet Take 10 mg by mouth once.    [provider]  Probiotic Product (PROBIOTIC DAILY PO) Take by mouth.    [provider]  venlafaxine XR (EFFEXOR-XR) 150 MG 24 hr capsule Take 1 capsule (150 mg total) by mouth daily with breakfast. 05/31/22 06/30/22  Ivonne Andrew, NP  venlafaxine XR (EFFEXOR-XR) 75 MG 24 hr capsule Take 3 capsules (225 mg total) by mouth daily with breakfast. 05/31/22   Ivonne Andrew, NP      Allergies    Bevelyn Buckles (malabar nut tree) [justicia adhatoda], Peanut-containing drug products, Cephalosporins, Penicillins, and Naproxen    Review of Systems   Review of Systems  Constitutional: Negative.   HENT: Negative.    Respiratory:  Positive for shortness of breath.   Cardiovascular: Negative.   Gastrointestinal:  Positive for nausea and vomiting.  Genitourinary: Negative.   Musculoskeletal: Negative.   Skin:  Positive for rash.  Neurological: Negative.   All other systems reviewed and are negative.   Physical Exam Updated Vital Signs BP 118/72 (BP Location: Left Arm)   Pulse (!) 116   Temp 98.1 F (36.7 C) (Oral)   Resp 18  Ht 5\' 1"  (1.549 m)   Wt 64.4 kg   LMP 05/11/2022 (Approximate)   SpO2 100%   BMI 26.83 kg/m  Physical Exam Vitals and nursing note reviewed.  Constitutional:      General: She is not in acute distress.    Appearance: She is well-developed. She is not ill-appearing, toxic-appearing or diaphoretic.  HENT:     Head: Normocephalic and atraumatic.     Comments: No angioedema    Nose: Nose normal.     Mouth/Throat:     Mouth: Mucous membranes are moist.     Comments: Tongue midline, no angioedema Eyes:     Pupils: Pupils are equal, round, and reactive to light.  Cardiovascular:     Rate and  Rhythm: Normal rate.     Pulses: Normal pulses.     Heart sounds: Normal heart sounds.  Pulmonary:     Effort: Pulmonary effort is normal. No respiratory distress.     Breath sounds: Normal breath sounds.     Comments: Clear bilaterally, speaks in full sentences, no stridor Abdominal:     General: Bowel sounds are normal. There is no distension.     Palpations: Abdomen is soft.  Musculoskeletal:        General: Normal range of motion.     Cervical back: Normal range of motion.  Skin:    General: Skin is warm and dry.     Capillary Refill: Capillary refill takes less than 2 seconds.  Neurological:     General: No focal deficit present.     Mental Status: She is alert and oriented to person, place, and time.  Psychiatric:        Mood and Affect: Mood normal.     ED Results / Procedures / Treatments   Labs (all labs ordered are listed, but only abnormal results are displayed) Labs Reviewed  CBC WITH DIFFERENTIAL/PLATELET - Abnormal; Notable for the following components:      Result Value   RBC 5.19 (*)    Platelets 419 (*)    Lymphs Abs 4.1 (*)    All other components within normal limits  COMPREHENSIVE METABOLIC PANEL - Abnormal; Notable for the following components:   Potassium 3.4 (*)    Glucose, Bld 155 (*)    AST 9 (*)    All other components within normal limits  CBG MONITORING, ED - Abnormal; Notable for the following components:   Glucose-Capillary 257 (*)    All other components within normal limits  PREGNANCY, URINE  HCG, SERUM, QUALITATIVE    EKG None  Radiology No results found.  Procedures .Critical Care  Performed by: 05/13/2022, PA-C Authorized by: Linwood Dibbles, PA-C   Critical care provider statement:    Critical care time (minutes):  35   Critical care was necessary to treat or prevent imminent or life-threatening deterioration of the following conditions:  Respiratory failure (Anaphylaxis)   Critical care was time spent  personally by me on the following activities:  Development of treatment plan with patient or surrogate, discussions with consultants, evaluation of patient's response to treatment, examination of patient, ordering and review of laboratory studies, ordering and review of radiographic studies, ordering and performing treatments and interventions, pulse oximetry, re-evaluation of patient's condition and review of old charts     Medications Ordered in ED Medications  methylPREDNISolone sodium succinate (SOLU-MEDROL) 125 mg/2 mL injection 125 mg (125 mg Intravenous Given 06/02/22 1402)  ondansetron (ZOFRAN) injection 4 mg (4 mg Intravenous  Given 06/02/22 1401)  famotidine (PEPCID) IVPB 20 mg premix (0 mg Intravenous Stopped 06/02/22 1518)  sodium chloride 0.9 % bolus 1,000 mL (0 mLs Intravenous Stopped 06/02/22 1518)  albuterol (PROVENTIL) (2.5 MG/3ML) 0.083% nebulizer solution 2.5 mg ( Nebulization Canceled Entry 06/02/22 1457)  EPINEPHrine (EPI-PEN) injection 0.3 mg (0.3 mg Intramuscular Given 06/02/22 1258)    ED Course/ Medical Decision Making/ A&P Clinical Course as of 06/02/22 2022  Fri Jun 02, 2022  1300 1300- EPI Pen given by triage RN [BH]  1500 Sx improved.  We will turn down oxygen at this time.  She does state she has prior history of rebound anaphylaxis approximation when she was 21 years old.  We will continue to monitor.  I do not feel she needs additional epinephrine at this time. [BH]  1730 Sx improver, will continue to monitor [BH]    Clinical Course User Index [BH] Gaines Cartmell A, PA-C    21 year old history of type 1 diabetes, known prior anaphylaxis to nuts here for evaluation of allergic reaction.  Was eating a new meal at [employer] which subsequently had cashews in it.  She started feeling her throat was going to close up had episode of emesis after taking Benadryl and urticaria.  On arrival she feels like her throat is closing up however has no acute respiratory distress.   Does have some urticaria.  Has needed IM epi previously.  Will give IM epi as well as additional medications for anaphylaxis.  She is tachycardic, vomiting will check her labs given history of type 1 diabetes  Labs personally reviewed and interpreted:  CBC no leukocytosis, hemoglobin stable CMP potassium 3.4 Preg negative   Patient reassessed.  Has wheeze, became hypoxic.  Respiratory at bedside.  Will order DuoNeb.  Do not feel we need to reepinephrine at this time  Patient reassessed, symptoms improved.  No longer hypoxic.  Has had some tachycardia however she states that typically gets this way with epinephrine.  We will continue to observe as patient has had history of rebound anaphylaxis requiring readmission after initial epinephrine.  No evidence of DKA, HHS at this time.  No infectious symptoms to suggest sepsis.  No chest pain, shortness of breath  Patient observed for 7 and half hours.  her symptoms have completely resolved.  Feel she is stable to DC home.  Wrote for EpiPen Rx as well as some steroids.  She will continue home Benadryl.  She will return for new or worsening symptoms.  The patient has been appropriately medically screened and/or stabilized in the ED. I have low suspicion for any other emergent medical condition which would require further screening, evaluation or treatment in the ED or require inpatient management.  Patient is hemodynamically stable and in no acute distress.  Patient able to ambulate in department prior to ED.  Evaluation does not show acute pathology that would require ongoing or additional emergent interventions while in the emergency department or further inpatient treatment.  I have discussed the diagnosis with the patient and answered all questions.  Pain is been managed while in the emergency department and patient has no further complaints prior to discharge.  Patient is comfortable with plan discussed in room and is stable for discharge at this  time.  I have discussed strict return precautions for returning to the emergency department.  Patient was encouraged to follow-up with PCP/specialist refer to at discharge.  Medical Decision Making Amount and/or Complexity of Data Reviewed External Data Reviewed: labs and notes. Labs: ordered. Decision-making details documented in ED Course.  Risk OTC drugs. Prescription drug management. Parenteral controlled substances. Decision regarding hospitalization. Diagnosis or treatment significantly limited by social determinants of health.           Final Clinical Impression(s) / ED Diagnoses Final diagnoses:  Anaphylaxis, initial encounter    Rx / DC Orders ED Discharge Orders          Ordered    EPINEPHrine 0.3 mg/0.3 mL IJ SOAJ injection  As needed        06/02/22 2005    predniSONE (DELTASONE) 20 MG tablet  Daily        06/02/22 2005              Tino Ronan A, PA-C 06/02/22 2022    Alvira Monday, MD 06/02/22 2257

## 2022-06-02 NOTE — ED Triage Notes (Signed)
Pateint reports she is allergic to cashews and she had a frozen meal from [employer] that contained cashews. Patient reports she feels short of breath. Patient states she took 75mg  of benadryl (50mg  and then vomited so she took 25 more)

## 2022-06-02 NOTE — ED Notes (Signed)
Pt stated that she was doing  ok

## 2022-06-02 NOTE — ED Notes (Signed)
Patient O2 saturation on monitor reading 82-86% on RA. Pulse ox was changed with O2 saturation unchanged. Patient was placed on a [place] and oxygen titrated to 4 L to maintain O2 sat of >90%. Patient BBS diminished w/ inspiratory wheezing noted, no stridor. Albuterol neb ordered and PA made aware of patient changes. Albuterol neb given and O2 sats 94-96% on 4 L [place]. Patient endorses improvement after neb. RT assessment completed and call bell within reach of patient.

## 2022-06-02 NOTE — ED Notes (Signed)
Pt states that she feels like the pressure in her chest has eased up and that she feels a little better. Pt denis pain.

## 2022-06-13 ENCOUNTER — Ambulatory Visit (HOSPITAL_BASED_OUTPATIENT_CLINIC_OR_DEPARTMENT_OTHER): Payer: No Typology Code available for payment source | Admitting: Psychiatry

## 2022-06-13 ENCOUNTER — Encounter (HOSPITAL_COMMUNITY): Payer: Self-pay | Admitting: Psychiatry

## 2022-06-13 DIAGNOSIS — F4310 Post-traumatic stress disorder, unspecified: Secondary | ICD-10-CM

## 2022-06-13 DIAGNOSIS — F319 Bipolar disorder, unspecified: Secondary | ICD-10-CM | POA: Diagnosis not present

## 2022-06-13 DIAGNOSIS — F411 Generalized anxiety disorder: Secondary | ICD-10-CM

## 2022-06-13 MED ORDER — VENLAFAXINE HCL ER 75 MG PO CP24: ORAL_CAPSULE | ORAL | 1 refills | Status: DC

## 2022-06-13 MED ORDER — LAMOTRIGINE 100 MG PO TABS: 100.0000 mg | ORAL_TABLET | Freq: Every day | ORAL | 1 refills | Status: DC

## 2022-06-13 MED ORDER — VENLAFAXINE HCL ER 150 MG PO CP24: 150.0000 mg | ORAL_CAPSULE | Freq: Every day | ORAL | 1 refills | Status: DC

## 2022-06-13 NOTE — Progress Notes (Signed)
Psychiatric Initial Adult Assessment   Patient Identification: Lindsey [name] MRN:  [id number] Date of Evaluation:  06/13/2022 Referral Source: Sol Blazing NP Chief Complaint:   Chief Complaint  Patient presents with   Post-Traumatic Stress Disorder   Visit Diagnosis:    ICD-10-CM   1. Bipolar 1 disorder (HCC)  F31.9 lamoTRIgine (LAMICTAL) 100 MG tablet    2. PTSD (post-traumatic stress disorder)  F43.10 venlafaxine XR (EFFEXOR-XR) 150 MG 24 hr capsule    venlafaxine XR (EFFEXOR XR) 75 MG 24 hr capsule       Assessment:  Lindsey [name] is a 21 y.o. y.o. female with a history of Bipolar disorder, PTSD, and GAD who presents in person to [facility] for initial evaluation.    Patient reports being diagnosed with GAD, PTSD, and bipolar disorder as a teenager.  She endorses a history of symptoms of manic-like behaviors including promiscuity, impulsiveness, auditory hallucinations (primarily during periods of increased stress), decreased need for sleep for about a week at a time, and irritability.  Patient denies substance use contributing to this manic episode and reports the last manic episode was 5 years ago.  Patient has had depressive episodes more recently where she experiences decreased energy, fatigue, anhedonia, amotivation, and hypersomnia.  These episodes can progressed to the point of passive and active SI and patient tried to commit suicide via overdose in 2020.  She was hospitalized at that time and her medication was titrated.  Patient also endorses symptoms of PTSD secondary to past trauma with symptoms of nightmares, flashbacks, and dissociations.  There has been a decreased incident of these episodes recently.  At this time it appears likely the patient has a diagnosis of PTSD and GAD.  While the diagnosis of bipolar disorder is a little bit less certain patient does note her mother also has a history of manic episodes and hallucinations.  We discussed  the risk of antidepressant medications on triggering a manic state which patient is aware and reports that she has not had a manic state since starting the venlafaxine 5 years ago.  For now we will continue the patient on her current medication regimen and follow-up in a month.  She is also in the process of looking for a therapist.  Plan: - Patient working on finding a therapist - Continue Lamictal 100 mg QD - Continue venlafaxine 150 capsule and 75 capsule for 225 mg QD - Follow up in a month  History of Present Illness: Patient reports that her past psychiatric history began around the age of 2 or 21 years old.  At that time her mom reached out to connect her with psychiatric supports and the patient was diagnosed with with MDD and GAD.  She was started on medications at this time and connected with therapist but is unable to remember what medications she has tried.  At age 21 patient reports that she was beginning to experience manic like symptoms with impulsivity, promiscuity, lack of sleep, auditory hallucinations, and overall reckless behavior without regard to her own safety.  She was hospitalized at that time and diagnosed with bipolar disorder.  Patient was started on Abilify however found it oversedating and negatively impacting her diabetes.  Patient was eventually stabilized on a regimen of Effexor and Lamictal though would still have depressive episodes during that time.  In 2020 she had a depressive episode that led to a suicide attempt by overdose and subsequent hospitalization.  Following that hospitalization her Effexor dose was increased  to 225 mg daily and she has been stable on her current regimen for the past 3 years.    Of note patient also does endorse a diagnosis of PTSD with past trauma history.  She lists a number of childhood events that involved with her father and a family friend.  Patient's father struggled with opiate use disorder and passed away last year.  Patient moved  with her mother from [place] to [place] when she was 89 where the 2 lived on their own away from the father.  Patient has experienced flashbacks, nightmares, and dissociations in the past but they have been decreasing in frequency and severity over the past few years.  We discussed the patient's medication regimen and she reports that she does feel stable on his current regimen.  With the only negative side effect or remaining depressive affect being periods of hypersomnia that do not interfere with work or school.  We went over the risk of triggering a manic episode while on antidepressant and explored the past manic history to confirm a diagnosis of bipolar disorder.  Patient acknowledged that her memory of the events from that period of time is a little bit scattered and while she does connect with bipolar disorder there could be the possibility that it is not the most accurate diagnosis.  Despite that for the time being she would like to remain on her current regimen as it has been effective for her with no onset of suicidal ideation or self-harm for the past 3 years.  Associated Signs/Symptoms: Depression Symptoms:  hypersomnia, loss of energy/fatigue, (Hypo) Manic Symptoms:   Denies any current symptoms though experienced auditory hallucinations, decreased sleep, impulsiveness, and reckless behaviors in the past. Anxiety Symptoms:   denies Psychotic Symptoms:   Experienced auditory hallucinations in the past. PTSD Symptoms: Had a traumatic exposure:  in child hood  Past Psychiatric History: Patient reports a past psychiatric history of bipolar disorder, general anxiety, and PTSD. She notes that she has had suicidal thoughts in the past with one prior attempt in 2020 by overdose and been hospitalized in 2015, 2017, and 2020. Patient has been on a psychiatric regimen of Lamictal 100  gm QD and venlafaxine 225 mg QD with good management of her symptoms.  She reports that she was also on Abilify  in the past which was stopped due to over sedation and negative effects on her diabetes.  Previous Psychotropic Medications: Yes   Substance Abuse History in the last 12 months:  Yes.    Consequences of Substance Abuse: Medical Consequences:  Decreased motivation  Past Medical History:  Past Medical History:  Diagnosis Date   Diabetes mellitus without complication (HCC)    History reviewed. No pertinent surgical history.  Family Psychiatric History: Patient reports a past psychiatric history of opiate use disorder in her father and subsequent overdose on fentanyl.  She also endorsed anxiety and depressive issues on her mother's side with her mother experiencing bipolar/manic symptoms in her 31s as well.  Family History:  Family History  Problem Relation Age of Onset   Depression Mother    Cancer Maternal Aunt    Stroke Paternal Aunt    Heart disease Maternal Grandmother    Diabetes Maternal Grandfather    Heart disease Paternal Grandmother    Stroke Paternal Grandfather     Social History:   Social History   Socioeconomic History   Marital status: Single    Spouse name: Not on file   Number of children:  Not on file   Years of education: Not on file   Highest education level: Not on file  Occupational History   Not on file  Tobacco Use   Smoking status: Never   Smokeless tobacco: Never  Vaping Use   Vaping Use: Some days   Substances: Nicotine, Flavoring  Substance and Sexual Activity   Alcohol use: Yes    Comment: occ   Drug use: Yes    Types: Marijuana   Sexual activity: Yes  Other Topics Concern   Not on file  Social History Narrative   Not on file   Social Determinants of Health   Financial Resource Strain: Not on file  Food Insecurity: Not on file  Transportation Needs: Not on file  Physical Activity: Not on file  Stress: Not on file  Social Connections: Not on file    Additional Social History: Patient was born in [place] as the youngest of 3  she grew up with her mother and father until around age 18.  Patient's father struggled with substance use disorder and patient and her mother left and moved to [facility] on their own when patient was 57. Around that time she did band experience her mental health issues.  In 2021 patient moved to the [place] area to be near a friend as [facility] no longer had good job or school opportunities for her.  She reports good connections with friends partner and family here.  Of note her family is in [place] including her mother and her brother.  Her sister still lives in [place].  Patient works as a Museum/gallery conservator and is planning to start school at a community college to pursue a biology and possible major conservation degree this coming semester.  Allergies:   Allergies  Allergen Reactions   Justicia Adhatoda (Malabar Nut Tree) [Justicia Adhatoda] Anaphylaxis   Peanut-Containing Drug Products Anaphylaxis   Cephalosporins Other (See Comments) and Nausea And Vomiting   Penicillins Other (See Comments) and Nausea And Vomiting   Naproxen Rash and Other (See Comments)    Metabolic Disorder Labs: No results found for: "HGBA1C", "MPG" No results found for: "PROLACTIN" Lab Results  Component Value Date   CHOL 150 04/06/2022   TRIG 28 04/06/2022   HDL 59 04/06/2022   CHOLHDL 2.5 04/06/2022   LDLCALC 84 04/06/2022   No results found for: "TSH"  Therapeutic Level Labs: No results found for: "LITHIUM" No results found for: "CBMZ" No results found for: "VALPROATE"  Current Medications: Current Outpatient Medications  Medication Sig Dispense Refill   lamoTRIgine (LAMICTAL) 100 MG tablet Take 1 tablet (100 mg total) by mouth daily. 30 tablet 1   venlafaxine XR (EFFEXOR XR) 75 MG 24 hr capsule Take with 150 mg dose for a total of 225 mg daily 30 capsule 1   venlafaxine XR (EFFEXOR-XR) 150 MG 24 hr capsule Take 1 capsule (150 mg total) by mouth daily. Take with 75 mg dose for a total of 225 mg  daily 30 capsule 1   Continuous Blood Gluc Sensor (DEXCOM G7 SENSOR) MISC CHANGE EVERY 10 DAYS (90 DAY SUPPLY)     EPINEPHrine 0.3 mg/0.3 mL IJ SOAJ injection Inject 0.3 mg into the muscle as needed for anaphylaxis. 1 each 0   insulin aspart (NOVOLOG) 100 UNIT/ML injection 100 Units See admin instructions.     Insulin Human (INSULIN PUMP) SOLN Inject into the skin 3 times daily with meals, bedtime and 2 AM.     loratadine (CLARITIN) 10  MG tablet Take 10 mg by mouth once.     Probiotic Product (PROBIOTIC DAILY PO) Take by mouth.     No current facility-administered medications for this visit.    Psychiatric Specialty Exam: Review of Systems  Last menstrual period 05/11/2022.There is no height or weight on file to calculate BMI.  General Appearance: Well Groomed  Eye Contact:  Good  Speech:  Clear and Coherent  Volume:  Normal  Mood:  Euthymic  Affect:  Appropriate  Thought Process:  Coherent and Linear  Orientation:  Full (Time, Place, and Person)  Thought Content:  Logical  Suicidal Thoughts:  No  Homicidal Thoughts:  No  Memory:  Immediate;   Good  Judgement:  Good  Insight:  Present  Psychomotor Activity:  NA  Concentration:  Concentration: Good  Recall:  Good  Fund of Knowledge:Good  Language: Good  Akathisia:  NA    AIMS (if indicated):  not done  Assets:  Architect Housing Social Support  ADL's:  Intact  Cognition: WNL  Sleep:  Good   Screenings: PHQ2-9    Flowsheet Row [facility]  PHQ-2 Total Score 1      Flowsheet Row Office Visit from 06/13/2022 in [facility] ED from 06/02/2022 in [facility] ED from 03/31/2022 in [facility] EMERGENCY DEPARTMENT  C-SSRS RISK CATEGORY No Risk No Risk No Risk        Collaboration of Care: Medication Management AEB medication  prescritpion  Patient/Guardian was advised Release of Information must be obtained prior to any record release in order to collaborate their care with an outside provider. Patient/Guardian was advised if they have not already done so to contact the registration department to sign all necessary forms in order for [place] to release information regarding their care.   Consent: Patient/Guardian gives verbal consent for treatment and assignment of benefits for services provided during this visit. Patient/Guardian expressed understanding and agreed to proceed.   Stasia Cavalier, MD 8/29/202310:47 AM    Virtual Visit via Video Note  I connected with Maragret Gorrell on 06/13/22 at 10:00 AM EDT by a video enabled telemedicine application and verified that I am speaking with the correct person using two identifiers.  Location: Patient: Home Provider: Home office   I discussed the limitations of evaluation and management by telemedicine and the availability of in person appointments. The patient expressed understanding and agreed to proceed.   I discussed the assessment and treatment plan with the patient. The patient was provided an opportunity to ask questions and all were answered. The patient agreed with the plan and demonstrated an understanding of the instructions.   The patient was advised to call back or seek an in-person evaluation if the symptoms worsen or if the condition fails to improve as anticipated.  I provided 45 minutes of non-face-to-face time during this encounter.   Stasia Cavalier, MD

## 2022-07-11 ENCOUNTER — Telehealth (HOSPITAL_BASED_OUTPATIENT_CLINIC_OR_DEPARTMENT_OTHER): Payer: No Typology Code available for payment source | Admitting: Psychiatry

## 2022-07-11 ENCOUNTER — Encounter (HOSPITAL_COMMUNITY): Payer: Self-pay | Admitting: Psychiatry

## 2022-07-11 DIAGNOSIS — F4310 Post-traumatic stress disorder, unspecified: Secondary | ICD-10-CM | POA: Diagnosis not present

## 2022-07-11 DIAGNOSIS — F319 Bipolar disorder, unspecified: Secondary | ICD-10-CM | POA: Diagnosis not present

## 2022-07-11 MED ORDER — VENLAFAXINE HCL ER 75 MG PO CP24: ORAL_CAPSULE | ORAL | 0 refills | Status: DC

## 2022-07-11 MED ORDER — LAMOTRIGINE 100 MG PO TABS: 100.0000 mg | ORAL_TABLET | Freq: Every day | ORAL | 0 refills | Status: DC

## 2022-07-11 MED ORDER — VENLAFAXINE HCL ER 150 MG PO CP24: 150.0000 mg | ORAL_CAPSULE | Freq: Every day | ORAL | 0 refills | Status: DC

## 2022-07-11 NOTE — Progress Notes (Signed)
BH MD/PA/NP OP Progress Note  07/11/2022 10:52 AM Lindsey [name]  MRN:  [id number]  Visit Diagnosis:    ICD-10-CM   1. Bipolar 1 disorder (HCC)  F31.9 lamoTRIgine (LAMICTAL) 100 MG tablet    2. PTSD (post-traumatic stress disorder)  F43.10 venlafaxine XR (EFFEXOR XR) 75 MG 24 hr capsule    venlafaxine XR (EFFEXOR-XR) 150 MG 24 hr capsule      Assessment: Lindsey [name] is a 21 y.o. y.o. female with a history of Bipolar disorder, PTSD, and GAD who presented in person to [facility] for initial evaluation on 06/12/22.  Patient reported being diagnosed with GAD, PTSD, and bipolar disorder as a teenager.  She endorsed a history of symptoms of manic-like behaviors including promiscuity, impulsiveness, auditory hallucinations (primarily during periods of increased stress), decreased need for sleep for about a week at a time, and irritability.  Patient denies substance use contributing to this manic episode and reports the last manic episode was 5 years ago.  Patient has had depressive episodes more recently where she experiences decreased energy, fatigue, anhedonia, amotivation, and hypersomnia.  These episodes can progressed to the point of passive and active SI and patient tried to commit suicide via overdose in 2020.  She was hospitalized at that time and her medication was titrated.  Patient also endorses symptoms of PTSD secondary to past trauma with symptoms of nightmares, flashbacks, and dissociations which have decreased in frequency over the last few years.  Patient meets criteria for a diagnosis of PTSD and GAD.  While the diagnosis of bipolar disorder is a little bit less certain patient does note her mother also has a history of manic episodes and hallucinations.  We discussed the risk of antidepressant medications on triggering a manic state which patient is aware and reports that she has not had a manic state since starting the venlafaxine 5 years ago.    Lindsey [name] presents for  follow-up evaluation. Today, 07/11/22, patient reports that she has continued to do well and remained stable on her medication regimen. She has experienced a few triggers that would have effected her mood in the past which she has handled well. She denies any SI or thoughts of self harm and expresses being able to reach out if that were to change. Patient denies any adverse medication side effects. Patient has connected with a therapist who she sees weekly but plans to decrease frequency to every other week going forward.   Plan: - Continue Lamictal 100 mg QD - Continue venlafaxine 150 capsule and 75 capsule for 225 mg QD - Continue with therapist every other week - Follow up in 3 months   Chief Complaint:  Chief Complaint  Patient presents with   Follow-up   HPI: Patient presents reporting that the last month has been going pretty well for her. She notes that her mood has been stable and the medications seem to be continuing to work well. There have been a few events that would have sent her off the rails in the past that she has handled well. She denies any adverse medication side effects at this time. Patient notes that she found a therapist who she likes and has been seeing weekly. She plans to decrease to every other week in the near future as she is doing well and feels the two have gotten to know each other well.   Personally patient reports she continues to do well in her English class and has an A. 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 She enjoys pet sitting as it allows for her to have animals around her home. Patient plans to take another class next semester.   Past Psychiatric History: Patient reports a past psychiatric history of bipolar disorder, general anxiety, and PTSD. She notes that she has had suicidal thoughts in the past with one prior attempt in 2020 by overdose and been hospitalized in 2015, 2017, and 2020. Patient has been on a psychiatric regimen  of Lamictal 100  gm QD and venlafaxine 225 mg QD with good management of her symptoms.  She reports that she was also on Abilify in the past which was stopped due to over sedation and negative effects on her diabetes.  Past Medical History:  Past Medical History:  Diagnosis Date   Diabetes mellitus without complication (HCC)    No past surgical history on file.  Family Psychiatric History: Patient reports a past psychiatric history of opiate use disorder in her father and subsequent overdose on fentanyl.  She also endorsed anxiety and depressive issues on her mother's side with her mother experiencing bipolar/manic symptoms in her 65s as well.  Family History:  Family History  Problem Relation Age of Onset   Depression Mother    Cancer Maternal Aunt    Stroke Paternal Aunt    Heart disease Maternal Grandmother    Diabetes Maternal Grandfather    Heart disease Paternal Grandmother    Stroke Paternal Grandfather     Social History:  Social History   Socioeconomic History   Marital status: Single    Spouse name: Not on file   Number of children: Not on file   Years of education: Not on file   Highest education level: Not on file  Occupational History   Not on file  Tobacco Use   Smoking status: Never   Smokeless tobacco: Never  Vaping Use   Vaping Use: Some days   Substances: Nicotine, Flavoring  Substance and Sexual Activity   Alcohol use: Yes    Comment: occ   Drug use: Yes    Types: Marijuana   Sexual activity: Yes  Other Topics Concern   Not on file  Social History Narrative   Not on file   Social Determinants of Health   Financial Resource Strain: Not on file  Food Insecurity: Not on file  Transportation Needs: Not on file  Physical Activity: Not on file  Stress: Not on file  Social Connections: Not on file    Allergies:  Allergies  Allergen Reactions   Justicia Adhatoda (Malabar Nut Tree) [Justicia Adhatoda] Anaphylaxis   Peanut-Containing Drug  Products Anaphylaxis   Cephalosporins Other (See Comments) and Nausea And Vomiting   Penicillins Other (See Comments) and Nausea And Vomiting   Naproxen Rash and Other (See Comments)    Current Medications: Current Outpatient Medications  Medication Sig Dispense Refill   Continuous Blood Gluc Sensor (DEXCOM G7 SENSOR) MISC CHANGE EVERY 10 DAYS (90 DAY SUPPLY)     EPINEPHrine 0.3 mg/0.3 mL IJ SOAJ injection Inject 0.3 mg into the muscle as needed for anaphylaxis. 1 each 0   insulin aspart (NOVOLOG) 100 UNIT/ML injection 100 Units See admin instructions.     Insulin Human (INSULIN PUMP) SOLN Inject into the skin 3 times daily with meals, bedtime and 2 AM.     lamoTRIgine (LAMICTAL) 100 MG tablet Take 1 tablet (100 mg total) by mouth daily. 90 tablet 0   loratadine (CLARITIN) 10  MG tablet Take 10 mg by mouth once.     Probiotic Product (PROBIOTIC DAILY PO) Take by mouth.     venlafaxine XR (EFFEXOR XR) 75 MG 24 hr capsule Take with 150 mg dose for a total of 225 mg daily 90 capsule 0   venlafaxine XR (EFFEXOR-XR) 150 MG 24 hr capsule Take 1 capsule (150 mg total) by mouth daily. Take with 75 mg dose for a total of 225 mg daily 90 capsule 0   No current facility-administered medications for this visit.     Psychiatric Specialty Exam: Review of Systems  There were no vitals taken for this visit.There is no height or weight on file to calculate BMI.  General Appearance: Well Groomed  Eye Contact:  Good  Speech:  Clear and Coherent  Volume:  Normal  Mood:  Euthymic  Affect:  Congruent  Thought Process:  Coherent  Orientation:  Full (Time, Place, and Person)  Thought Content: Logical   Suicidal Thoughts:  No  Homicidal Thoughts:  No  Memory:  Immediate;   Good  Judgement:  Good  Insight:  Good  Psychomotor Activity:  Normal  Concentration:  Concentration: Good  Recall:  Good  Fund of Knowledge: Good  Language: Good  Akathisia:  NA    AIMS (if indicated): not done  Assets:   Architect Physical Health Resilience  ADL's:  Intact  Cognition: WNL  Sleep:  Good   Metabolic Disorder Labs: No results found for: "HGBA1C", "MPG" No results found for: "PROLACTIN" Lab Results  Component Value Date   CHOL 150 04/06/2022   TRIG 28 04/06/2022   HDL 59 04/06/2022   CHOLHDL 2.5 04/06/2022   LDLCALC 84 04/06/2022   No results found for: "TSH"  Therapeutic Level Labs: No results found for: "LITHIUM" No results found for: "VALPROATE" No results found for: "CBMZ"   Screenings: PHQ2-9    Flowsheet Row Office Visit from [facility]13/2022 in [facility]  PHQ-2 Total Score 1      Flowsheet Row Office Visit from 06/13/2022 in [facility] ED from 06/02/2022 in [facility] ED from 03/31/2022 in [facility] EMERGENCY DEPARTMENT  C-SSRS RISK CATEGORY No Risk No Risk No Risk       Collaboration of Care: Collaboration of Care: Medication Management AEB medication prescription  Patient/Guardian was advised Release of Information must be obtained prior to any record release in order to collaborate their care with an outside provider. Patient/Guardian was advised if they have not already done so to contact the registration department to sign all necessary forms in order for [place] to release information regarding their care.   Consent: Patient/Guardian gives verbal consent for treatment and assignment of benefits for services provided during this visit. Patient/Guardian expressed understanding and agreed to proceed.    Lindsey Cavalier, MD 07/11/2022, 10:52 AM   Virtual Visit via Video Note  I connected with Lindsey [name] on 07/11/22 at 10:30 AM EDT by a video enabled telemedicine application and verified that I am speaking with the correct person using two identifiers.  Location: Patient: Home Provider: Home Office    I discussed the limitations of evaluation and management by telemedicine and the availability of in person appointments. The patient expressed understanding and agreed to proceed.   I discussed the assessment and treatment plan with the patient. The patient was provided an opportunity to ask questions and all were answered. The patient agreed with the  plan and demonstrated an understanding of the instructions.   The patient was advised to call back or seek an in-person evaluation if the symptoms worsen or if the condition fails to improve as anticipated.  I provided 15 minutes of non-face-to-face time during this encounter.   Lindsey Mink, MD

## 2022-07-13 ENCOUNTER — Telehealth (HOSPITAL_COMMUNITY): Payer: No Typology Code available for payment source | Admitting: Psychiatry

## 2022-09-26 ENCOUNTER — Telehealth (HOSPITAL_COMMUNITY): Payer: No Typology Code available for payment source | Admitting: Psychiatry

## 2022-10-02 ENCOUNTER — Other Ambulatory Visit (HOSPITAL_COMMUNITY): Payer: Self-pay | Admitting: *Deleted

## 2022-10-02 DIAGNOSIS — F4310 Post-traumatic stress disorder, unspecified: Secondary | ICD-10-CM

## 2022-10-02 DIAGNOSIS — F319 Bipolar disorder, unspecified: Secondary | ICD-10-CM

## 2022-10-02 MED ORDER — VENLAFAXINE HCL ER 150 MG PO CP24: 150.0000 mg | ORAL_CAPSULE | Freq: Every day | ORAL | 1 refills | Status: DC

## 2022-10-02 MED ORDER — LAMOTRIGINE 100 MG PO TABS: 100.0000 mg | ORAL_TABLET | Freq: Every day | ORAL | 1 refills | Status: DC

## 2022-10-02 MED ORDER — VENLAFAXINE HCL ER 75 MG PO CP24: ORAL_CAPSULE | ORAL | 1 refills | Status: DC

## 2022-10-25 ENCOUNTER — Encounter (HOSPITAL_COMMUNITY): Payer: Self-pay

## 2022-10-25 ENCOUNTER — Encounter (HOSPITAL_COMMUNITY): Payer: No Typology Code available for payment source | Admitting: Psychiatry

## 2022-10-25 NOTE — Progress Notes (Signed)
This encounter was created in error - please disregard.

## 2022-10-25 NOTE — Progress Notes (Deleted)
BH MD/PA/NP OP Progress Note  10/25/2022 1:28 PM Lindsey [name]  MRN:  [id number]  Visit Diagnosis:    ICD-10-CM   1. PTSD (post-traumatic stress disorder)  F43.10     2. Bipolar 1 disorder ([place])  F31.9       Assessment: Lindsey [name] is a 22 y.o. y.o. female with a history of Bipolar disorder, PTSD, and GAD who presented in person to [facility] for initial evaluation on 06/12/22.  Patient reported being diagnosed with GAD, PTSD, and bipolar disorder as a teenager.  She endorsed a history of symptoms of manic-like behaviors including promiscuity, impulsiveness, auditory hallucinations (primarily during periods of increased stress), decreased need for sleep for about a week at a time, and irritability.  Patient denies substance use contributing to this manic episode and reports the last manic episode was 5 years ago.  Patient has had depressive episodes more recently where she experiences decreased energy, fatigue, anhedonia, amotivation, and hypersomnia.  These episodes can progressed to the point of passive and active SI and patient tried to commit suicide via overdose in 2020.  She was hospitalized at that time and her medication was titrated.  Patient also endorses symptoms of PTSD secondary to past trauma with symptoms of nightmares, flashbacks, and dissociations which have decreased in frequency over the last few years.  Patient meets criteria for a diagnosis of PTSD and GAD.  While the diagnosis of bipolar disorder is a little bit less certain patient does note her mother also has a history of manic episodes and hallucinations.  We discussed the risk of antidepressant medications on triggering a manic state which patient is aware and reports that she has not had a manic state since starting the venlafaxine 5 years ago.    Lindsey [name] presents for follow-up evaluation. Today, 10/25/22,    patient reports that she has continued to do well and remained stable on her medication  regimen. She has experienced a few triggers that would have effected her mood in the past which she has handled well. She denies any SI or thoughts of self harm and expresses being able to reach out if that were to change. Patient denies any adverse medication side effects. Patient has connected with a therapist who she sees weekly but plans to decrease frequency to every other week going forward.   Plan: - Continue Lamictal 100 mg QD - Continue venlafaxine 150 capsule and 75 capsule for 225 mg QD - Continue with therapist every other week - Follow up in 3 months   Chief Complaint:  Chief Complaint  Patient presents with   Follow-up   Medication Refill   HPI: Patient presents reporting that the last month has been going pretty well for her. She notes that her mood has been stable and the medications seem to be continuing to work well. There have been a few events that would have sent her off the rails in the past that she has handled well. She denies any adverse medication side effects at this time. Patient notes that she found a therapist who she likes and has been seeing weekly. She plans to decrease to every other week in the near future as she is doing well and feels the two have gotten to know each other well.   Personally patient reports she continues to do well in her English class and has an A. 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 She enjoys pet sitting as  it allows for her to have animals around her home. Patient plans to take another class next semester.   Past Psychiatric History: Patient reports a past psychiatric history of bipolar disorder, general anxiety, and PTSD. She notes that she has had suicidal thoughts in the past with one prior attempt in 2020 by overdose and been hospitalized in 2015, 2017, and 2020. Patient has been on a psychiatric regimen of Lamictal 100  gm QD and venlafaxine 225 mg QD with good management of her symptoms.  She reports that  she was also on Abilify in the past which was stopped due to over sedation and negative effects on her diabetes.  Past Medical History:  Past Medical History:  Diagnosis Date   Diabetes mellitus without complication (HCC)    No past surgical history on file.  Family Psychiatric History: Patient reports a past psychiatric history of opiate use disorder in her father and subsequent overdose on fentanyl.  She also endorsed anxiety and depressive issues on her mother's side with her mother experiencing bipolar/manic symptoms in her 7s as well.  Family History:  Family History  Problem Relation Age of Onset   Depression Mother    Cancer Maternal Aunt    Stroke Paternal Aunt    Heart disease Maternal Grandmother    Diabetes Maternal Grandfather    Heart disease Paternal Grandmother    Stroke Paternal Grandfather     Social History:  Social History   Socioeconomic History   Marital status: Single    Spouse name: Not on file   Number of children: Not on file   Years of education: Not on file   Highest education level: Not on file  Occupational History   Not on file  Tobacco Use   Smoking status: Never   Smokeless tobacco: Never  Vaping Use   Vaping Use: Some days   Substances: Nicotine, Flavoring  Substance and Sexual Activity   Alcohol use: Yes    Comment: occ   Drug use: Yes    Types: Marijuana   Sexual activity: Yes  Other Topics Concern   Not on file  Social History Narrative   Not on file   Social Determinants of Health   Financial Resource Strain: Not on file  Food Insecurity: Not on file  Transportation Needs: Not on file  Physical Activity: Not on file  Stress: Not on file  Social Connections: Not on file    Allergies:  Allergies  Allergen Reactions   Justicia Adhatoda (Malabar Nut Tree) [Justicia Adhatoda] Anaphylaxis   Peanut-Containing Drug Products Anaphylaxis   Cephalosporins Other (See Comments) and Nausea And Vomiting   Penicillins Other (See  Comments) and Nausea And Vomiting   Naproxen Rash and Other (See Comments)    Current Medications: Current Outpatient Medications  Medication Sig Dispense Refill   Continuous Blood Gluc Sensor (DEXCOM G7 SENSOR) MISC CHANGE EVERY 10 DAYS (90 DAY SUPPLY)     EPINEPHrine 0.3 mg/0.3 mL IJ SOAJ injection Inject 0.3 mg into the muscle as needed for anaphylaxis. 1 each 0   insulin aspart (NOVOLOG) 100 UNIT/ML injection 100 Units See admin instructions.     Insulin Human (INSULIN PUMP) SOLN Inject into the skin 3 times daily with meals, bedtime and 2 AM.     lamoTRIgine (LAMICTAL) 100 MG tablet Take 1 tablet (100 mg total) by mouth daily. 30 tablet 1   loratadine (CLARITIN) 10 MG tablet Take 10 mg by mouth once.     Probiotic Product (PROBIOTIC DAILY  PO) Take by mouth.     venlafaxine XR (EFFEXOR XR) 75 MG 24 hr capsule Take with 150 mg dose for a total of 225 mg daily 30 capsule 1   venlafaxine XR (EFFEXOR-XR) 150 MG 24 hr capsule Take 1 capsule (150 mg total) by mouth daily. Take with 75 mg dose for a total of 225 mg daily 30 capsule 1   No current facility-administered medications for this visit.     Psychiatric Specialty Exam: Review of Systems  There were no vitals taken for this visit.There is no height or weight on file to calculate BMI.  General Appearance: Well Groomed  Eye Contact:  Good  Speech:  Clear and Coherent  Volume:  Normal  Mood:  Euthymic  Affect:  Congruent  Thought Process:  Coherent  Orientation:  Full (Time, Place, and Person)  Thought Content: Logical   Suicidal Thoughts:  No  Homicidal Thoughts:  No  Memory:  Immediate;   Good  Judgement:  Good  Insight:  Good  Psychomotor Activity:  Normal  Concentration:  Concentration: Good  Recall:  Good  Fund of Knowledge: Good  Language: Good  Akathisia:  NA    AIMS (if indicated): not done  Assets:  Architect Physical Health Resilience  ADL's:  Intact  Cognition:  WNL  Sleep:  Good   Metabolic Disorder Labs: No results found for: "HGBA1C", "MPG" No results found for: "PROLACTIN" Lab Results  Component Value Date   CHOL 150 04/06/2022   TRIG 28 04/06/2022   HDL 59 04/06/2022   CHOLHDL 2.5 04/06/2022   LDLCALC 84 04/06/2022   No results found for: "TSH"  Therapeutic Level Labs: No results found for: "LITHIUM" No results found for: "VALPROATE" No results found for: "CBMZ"   Screenings: PHQ2-9    Flowsheet Row [facility]  PHQ-2 Total Score 1      Flowsheet Row Office Visit from 06/13/2022 in [facility] from 06/02/2022 in [facility] ED from 03/31/2022 in [facility] EMERGENCY DEPARTMENT  C-SSRS RISK CATEGORY No Risk No Risk No Risk       Collaboration of Care: Collaboration of Care: Medication Management AEB medication prescription  Patient/Guardian was advised Release of Information must be obtained prior to any record release in order to collaborate their care with an outside provider. Patient/Guardian was advised if they have not already done so to contact the registration department to sign all necessary forms in order for [place] to release information regarding their care.   Consent: Patient/Guardian gives verbal consent for treatment and assignment of benefits for services provided during this visit. Patient/Guardian expressed understanding and agreed to proceed.    Stasia Cavalier, MD 10/25/2022, 1:28 PM   Virtual Visit via Video Note  I connected with Lindsey [name] on 10/25/22 at  4:00 PM EST by a video enabled telemedicine application and verified that I am speaking with the correct person using two identifiers.  Location: Patient: Home Provider: Home Office   I discussed the limitations of evaluation and management by telemedicine and the availability of in person  appointments. The patient expressed understanding and agreed to proceed.   I discussed the assessment and treatment plan with the patient. The patient was provided an opportunity to ask questions and all were answered. The patient agreed with the plan and demonstrated an understanding of the instructions.   The patient was advised to  call back or seek an in-person evaluation if the symptoms worsen or if the condition fails to improve as anticipated.  I provided 15 minutes of non-face-to-face time during this encounter.   Vista Mink, MD

## 2022-11-16 ENCOUNTER — Telehealth (HOSPITAL_COMMUNITY): Payer: No Typology Code available for payment source | Admitting: Psychiatry

## 2022-12-13 ENCOUNTER — Other Ambulatory Visit (HOSPITAL_COMMUNITY): Payer: Self-pay | Admitting: *Deleted

## 2022-12-13 DIAGNOSIS — F319 Bipolar disorder, unspecified: Secondary | ICD-10-CM

## 2022-12-13 DIAGNOSIS — F4310 Post-traumatic stress disorder, unspecified: Secondary | ICD-10-CM

## 2022-12-13 MED ORDER — VENLAFAXINE HCL ER 150 MG PO CP24: 150.0000 mg | ORAL_CAPSULE | Freq: Every day | ORAL | 0 refills | Status: DC

## 2022-12-13 MED ORDER — LAMOTRIGINE 100 MG PO TABS: 100.0000 mg | ORAL_TABLET | Freq: Every day | ORAL | 0 refills | Status: DC

## 2022-12-13 MED ORDER — VENLAFAXINE HCL ER 75 MG PO CP24: ORAL_CAPSULE | ORAL | 0 refills | Status: DC

## 2022-12-29 ENCOUNTER — Telehealth (HOSPITAL_BASED_OUTPATIENT_CLINIC_OR_DEPARTMENT_OTHER): Payer: No Typology Code available for payment source | Admitting: Psychiatry

## 2022-12-29 ENCOUNTER — Encounter (HOSPITAL_COMMUNITY): Payer: Self-pay | Admitting: Psychiatry

## 2022-12-29 DIAGNOSIS — F319 Bipolar disorder, unspecified: Secondary | ICD-10-CM

## 2022-12-29 DIAGNOSIS — F4310 Post-traumatic stress disorder, unspecified: Secondary | ICD-10-CM

## 2022-12-29 MED ORDER — VENLAFAXINE HCL ER 150 MG PO CP24: 150.0000 mg | ORAL_CAPSULE | Freq: Every day | ORAL | 1 refills | Status: DC

## 2022-12-29 MED ORDER — LAMOTRIGINE 100 MG PO TABS: 100.0000 mg | ORAL_TABLET | Freq: Every day | ORAL | 1 refills | Status: DC

## 2022-12-29 MED ORDER — VENLAFAXINE HCL ER 75 MG PO CP24: ORAL_CAPSULE | ORAL | 1 refills | Status: DC

## 2022-12-29 NOTE — Progress Notes (Signed)
BH MD/PA/NP OP Progress Note  12/29/2022 9:59 AM Lindsey [name]  MRN:  [id number]  Visit Diagnosis:    ICD-10-CM   1. Bipolar 1 disorder (HCC)  F31.9 lamoTRIgine (LAMICTAL) 100 MG tablet    2. PTSD (post-traumatic stress disorder)  F43.10 venlafaxine XR (EFFEXOR-XR) 150 MG 24 hr capsule    venlafaxine XR (EFFEXOR XR) 75 MG 24 hr capsule      Assessment: Lindsey [name] is a 22 y.o. female with a history of Bipolar disorder, PTSD, and GAD who presented in person to [facility] for initial evaluation on 06/12/22.  At initial evaluation patient reported being diagnosed with GAD, PTSD, and bipolar disorder as a teenager.  She endorsed a history of symptoms of manic-like behaviors including promiscuity, impulsiveness, auditory hallucinations (primarily during periods of increased stress), decreased need for sleep for about a week at a time, and irritability.  Patient denied substance use contributing to this manic episode and reports the last manic episode was 5 years ago.  Patient has had depressive episodes more recently where she experiences decreased energy, fatigue, anhedonia, amotivation, and hypersomnia.  These episodes can progressed to the point of passive and active SI and patient tried to commit suicide via overdose in 2020.  She was hospitalized at that time and her medication was titrated.  Patient also endorsed symptoms of PTSD secondary to past trauma with symptoms of nightmares, flashbacks, and dissociations which have decreased in frequency over the last few years.  Patient meets criteria for a diagnosis of PTSD and GAD.  While the diagnosis of bipolar disorder is a little bit less certain patient does note her mother also has a history of manic episodes and hallucinations.  We discussed the risk of antidepressant medications on triggering a manic state which patient is aware and reports that she has not had a manic state since starting the venlafaxine 5 years ago.    Lindsey  [name] presents for follow-up evaluation. Today, 12/29/22, patient reports that things have been fairly stable over the past 6 months.  The only concern she listed were episodes of increased sedation and sleep lasting the whole day which occur once every couple weeks.  This has been a long time issue and increasing frequency when stress levels are elevated.  We discussed this today as well as effects of the medication and explained that if they had been causing this increased sedation they would likely be more consistent and not once every few weeks.  It is possible that symptoms are secondary to increased anxiety or an underlying sleep disorder.  At this time patient continue to monitor the symptoms and follow up with her therapist.  We will continue on her current regimen and follow-up in 3 months.  Plan: - Continue Lamictal 100 mg QD - Continue venlafaxine 150 capsule and 75 capsule for 225 mg QD - Continue with therapist every other week - Follow up in 3 months   Chief Complaint:  Chief Complaint  Patient presents with   Follow-up   HPI: Patient presents reporting that the last 6 months have been about the same. She is still working (now in [place]) at [employer] that patient bit more and she is really enjoying.  Has been about 6 weeks and she is going to see a lot of interesting things in addition to the 5 being a bit less hectic.  She also continues with school and notes that is going well.  She got all A's for last semester  and so far is on track to do so again this semester.  In regards to the medications she reports things have been fairly steady.  The only issue she has is that every few weeks she can have a day where she had a really hard time getting up in the morning.  She will wake up call while at work and an proceed to sleep the rest of the day not getting up to eat or go to the bathroom.  Lindsey [name] notes that this has been going on for a long period of time and cannot  completely remember if it predates her starting on this medication regimen.  We did explore the occurrence of these and she notes no identifiable triggers other than some slight increase in fatigue the days leading up to the episode. She has found that when she is more stressed the episodes are more frequent.  We discussed these and possible causes such as an underlying sleep disorder or increased stress/anxiety.  We also reviewed her current medications and explained how it is possible for them to increase sedation however it be less likely to occur in this pattern of once every 2 weeks.  Past Psychiatric History: Patient reports a past psychiatric history of bipolar disorder, general anxiety, and PTSD. She notes that she has had suicidal thoughts in the past with one prior attempt in 2020 by overdose and been hospitalized in 2015, 2017, and 2020. Patient has been on a psychiatric regimen of Lamictal 100  gm QD and venlafaxine 225 mg QD with good management of her symptoms.  She reports that she was also on Abilify in the past which was stopped due to over sedation and negative effects on her diabetes.  Past Medical History:  Past Medical History:  Diagnosis Date   Diabetes mellitus without complication ([place])    History reviewed. No pertinent surgical history.  Family Psychiatric History: Patient reports a past psychiatric history of opiate use disorder in her father and subsequent overdose on fentanyl.  She also endorsed anxiety and depressive issues on her mother's side with her mother experiencing bipolar/manic symptoms in her 62s as well.  Family History:  Family History  Problem Relation Age of Onset   Depression Mother    Cancer Maternal Aunt    Stroke Paternal Aunt    Heart disease Maternal Grandmother    Diabetes Maternal Grandfather    Heart disease Paternal Grandmother    Stroke Paternal Grandfather     Social History:  Social History   Socioeconomic History   Marital status:  Single    Spouse name: Not on file   Number of children: Not on file   Years of education: Not on file   Highest education level: Not on file  Occupational History   Not on file  Tobacco Use   Smoking status: Never   Smokeless tobacco: Never  Vaping Use   Vaping Use: Some days   Substances: Nicotine, Flavoring  Substance and Sexual Activity   Alcohol use: Yes    Comment: occ   Drug use: Yes    Types: Marijuana   Sexual activity: Yes  Other Topics Concern   Not on file  Social History Narrative   Not on file   Social Determinants of Health   Financial Resource Strain: Not on file  Food Insecurity: Not on file  Transportation Needs: Not on file  Physical Activity: Not on file  Stress: Not on file  Social Connections: Not on file  Allergies:  Allergies  Allergen Reactions   Justicia Adhatoda (Malabar Nut Tree) [Justicia Adhatoda] Anaphylaxis   Peanut-Containing Drug Products Anaphylaxis   Cephalosporins Other (See Comments) and Nausea And Vomiting   Penicillins Other (See Comments) and Nausea And Vomiting   Naproxen Rash and Other (See Comments)    Current Medications: Current Outpatient Medications  Medication Sig Dispense Refill   Continuous Blood Gluc Sensor (DEXCOM G7 SENSOR) MISC CHANGE EVERY 10 DAYS (90 DAY SUPPLY)     EPINEPHrine 0.3 mg/0.3 mL IJ SOAJ injection Inject 0.3 mg into the muscle as needed for anaphylaxis. 1 each 0   insulin aspart (NOVOLOG) 100 UNIT/ML injection 100 Units See admin instructions.     Insulin Human (INSULIN PUMP) SOLN Inject into the skin 3 times daily with meals, bedtime and 2 AM.     lamoTRIgine (LAMICTAL) 100 MG tablet Take 1 tablet (100 mg total) by mouth daily. 90 tablet 1   loratadine (CLARITIN) 10 MG tablet Take 10 mg by mouth once.     Probiotic Product (PROBIOTIC DAILY PO) Take by mouth.     venlafaxine XR (EFFEXOR XR) 75 MG 24 hr capsule Take with 150 mg dose for a total of 225 mg daily 90 capsule 1   venlafaxine XR  (EFFEXOR-XR) 150 MG 24 hr capsule Take 1 capsule (150 mg total) by mouth daily. Take with 75 mg dose for a total of 225 mg daily 90 capsule 1   No current facility-administered medications for this visit.     Psychiatric Specialty Exam: Review of Systems  There were no vitals taken for this visit.There is no height or weight on file to calculate BMI.  General Appearance: Well Groomed  Eye Contact:  Good  Speech:  Clear and Coherent  Volume:  Normal  Mood:  Euthymic  Affect:  Congruent  Thought Process:  Coherent  Orientation:  Full (Time, Place, and Person)  Thought Content: Logical   Suicidal Thoughts:  No  Homicidal Thoughts:  No  Memory:  Immediate;   Good  Judgement:  Good  Insight:  Good  Psychomotor Activity:  Normal  Concentration:  Concentration: Good  Recall:  Good  Fund of Knowledge: Good  Language: Good  Akathisia:  NA    AIMS (if indicated): not done  Assets:  Agricultural consultant Physical Health Resilience  ADL's:  Intact  Cognition: WNL  Sleep:  Good   Metabolic Disorder Labs: No results found for: "HGBA1C", "MPG" No results found for: "PROLACTIN" Lab Results  Component Value Date   CHOL 150 04/06/2022   TRIG 28 04/06/2022   HDL 59 04/06/2022   CHOLHDL 2.5 04/06/2022   LDLCALC 84 04/06/2022   No results found for: "TSH"  Therapeutic Level Labs: No results found for: "LITHIUM" No results found for: "VALPROATE" No results found for: "CBMZ"   Screenings: PHQ2-9    Flowsheet Row Office Visit from 06/13/2022 in [facility]  PHQ-2 Total Score 1      [place] Office Visit from 06/13/2022 in [facility] from 06[facility]02/2022 in [facility] ED from 03/31/2022 in [facility] at [place] No Risk No Risk No Risk       Collaboration of Care:  Collaboration of Care: Medication Management AEB medication prescription  Patient/Guardian was advised Release of Information must be obtained prior to any record release in order to collaborate their care with an  outside provider. Patient/Guardian was advised if they have not already done so to contact the registration department to sign all necessary forms in order for [place] to release information regarding their care.   Consent: Patient/Guardian gives verbal consent for treatment and assignment of benefits for services provided during this visit. Patient/Guardian expressed understanding and agreed to proceed.    Vista Mink, MD 12/29/2022, 9:59 AM   Virtual Visit via Video Note  I connected with Lindsey [name] on 12/29/22 at  9:30 AM EDT by a video enabled telemedicine application and verified that I am speaking with the correct person using two identifiers.  Location: Patient: Home Provider: Home Office   I discussed the limitations of evaluation and management by telemedicine and the availability of in person appointments. The patient expressed understanding and agreed to proceed.   I discussed the assessment and treatment plan with the patient. The patient was provided an opportunity to ask questions and all were answered. The patient agreed with the plan and demonstrated an understanding of the instructions.   The patient was advised to call back or seek an in-person evaluation if the symptoms worsen or if the condition fails to improve as anticipated.  I provided 15 minutes of non-face-to-face time during this encounter.   Vista Mink, MD

## 2023-01-11 ENCOUNTER — Telehealth: Payer: Self-pay

## 2023-01-11 ENCOUNTER — Telehealth: Payer: Self-pay | Admitting: Nurse Practitioner

## 2023-01-11 NOTE — Telephone Encounter (Signed)
She said she needs the endocrinology referral for her Type 1 diabetes, she had an endocrin team but they are out of town and she needs a local endocrinologist

## 2023-01-15 ENCOUNTER — Other Ambulatory Visit: Payer: Self-pay | Admitting: Nurse Practitioner

## 2023-01-15 DIAGNOSIS — E109 Type 1 diabetes mellitus without complications: Secondary | ICD-10-CM

## 2023-01-17 NOTE — Telephone Encounter (Signed)
Referral sent in. [place]

## 2023-01-25 NOTE — Telephone Encounter (Signed)
Sent my chart message [facility] 

## 2023-03-01 ENCOUNTER — Other Ambulatory Visit: Payer: Self-pay

## 2023-03-01 DIAGNOSIS — E109 Type 1 diabetes mellitus without complications: Secondary | ICD-10-CM

## 2023-03-02 ENCOUNTER — Other Ambulatory Visit: Payer: No Typology Code available for payment source

## 2023-03-06 ENCOUNTER — Telehealth: Payer: Self-pay | Admitting: "Endocrinology

## 2023-03-06 NOTE — Telephone Encounter (Signed)
Patient is calling to say that she wants to go to [facility] in [place], [place] tomorrow to get labs done:  [facility] [address] [place] [address]  Phone:  [phone] Fax:  [phone]

## 2023-03-07 ENCOUNTER — Other Ambulatory Visit: Payer: Self-pay

## 2023-03-07 DIAGNOSIS — E109 Type 1 diabetes mellitus without complications: Secondary | ICD-10-CM

## 2023-03-07 NOTE — Progress Notes (Signed)
Orders faxed to [facility]

## 2023-03-07 NOTE — Telephone Encounter (Signed)
Orders Faxed

## 2023-03-09 ENCOUNTER — Encounter: Payer: Self-pay | Admitting: "Endocrinology

## 2023-03-09 ENCOUNTER — Ambulatory Visit (INDEPENDENT_AMBULATORY_CARE_PROVIDER_SITE_OTHER): Payer: No Typology Code available for payment source | Admitting: "Endocrinology

## 2023-03-09 ENCOUNTER — Telehealth: Payer: Self-pay

## 2023-03-09 VITALS — BP 104/62 | HR 74 | Ht 61.0 in | Wt 147.0 lb

## 2023-03-09 DIAGNOSIS — E10649 Type 1 diabetes mellitus with hypoglycemia without coma: Secondary | ICD-10-CM

## 2023-03-09 DIAGNOSIS — E7800 Pure hypercholesterolemia, unspecified: Secondary | ICD-10-CM | POA: Diagnosis not present

## 2023-03-09 DIAGNOSIS — E1065 Type 1 diabetes mellitus with hyperglycemia: Secondary | ICD-10-CM | POA: Diagnosis not present

## 2023-03-09 DIAGNOSIS — E109 Type 1 diabetes mellitus without complications: Secondary | ICD-10-CM

## 2023-03-09 LAB — COMPREHENSIVE METABOLIC PANEL
ALT: 11 U/L (ref 0–35)
AST: 10 U/L (ref 0–37)
Albumin: 3.9 g/dL (ref 3.5–5.2)
Alkaline Phosphatase: 65 U/L (ref 39–117)
BUN: 7 mg/dL (ref 6–23)
CO2: 27 mEq/L (ref 19–32)
Calcium: 9.2 mg/dL (ref 8.4–10.5)
Chloride: 101 mEq/L (ref 96–112)
Creatinine, Ser: 0.64 mg/dL (ref 0.40–1.20)
GFR: 124.72 mL/min (ref >60.00)
Glucose, Bld: 283 mg/dL — ABNORMAL HIGH (ref 70–99)
Potassium: 4.4 mEq/L (ref 3.5–5.1)
Sodium: 138 mEq/L (ref 135–145)
Total Bilirubin: 0.5 mg/dL (ref 0.2–1.2)
Total Protein: 6.2 g/dL (ref 6.0–8.3)

## 2023-03-09 LAB — LIPID PANEL
Cholesterol: 154 mg/dL (ref 0–200)
HDL: 63.9 mg/dL (ref >39.00)
LDL Cholesterol: 82 mg/dL (ref 0–99)
NonHDL: 90.35
Total CHOL/HDL Ratio: 2
Triglycerides: 43 mg/dL (ref 0.0–149.0)
VLDL: 8.6 mg/dL (ref 0.0–40.0)

## 2023-03-09 LAB — POCT GLYCOSYLATED HEMOGLOBIN (HGB A1C): Hemoglobin A1C: 10.1 % — AB (ref 4.0–5.6)

## 2023-03-09 LAB — HEMOGLOBIN A1C: Hgb A1c MFr Bld: 9.9 % — ABNORMAL HIGH (ref 4.6–6.5)

## 2023-03-09 MED ORDER — DEXCOM G7 SENSOR MISC: 1.0000 | 0 refills | Status: AC

## 2023-03-09 MED ORDER — INSULIN ASPART 100 UNIT/ML IJ SOLN: 100.0000 [IU] | INTRAMUSCULAR | 1 refills | Status: DC

## 2023-03-09 NOTE — Telephone Encounter (Signed)
-----   Message from Altamese Quesada, MD sent at 03/09/2023 10:23 AM EDT ----- She needs t-slim supplies sent through [facility]

## 2023-03-09 NOTE — Addendum Note (Signed)
Addended by: Clearnce Sorrel on: 03/09/2023 02:45 PM   Modules accepted: Orders

## 2023-03-09 NOTE — Telephone Encounter (Signed)
Order has been started through [facility]

## 2023-03-09 NOTE — Progress Notes (Signed)
Outpatient Endocrinology Note Lindsey Lyons, MD  03/09/23   Lindsey [name] [birth year] [id number]  Referring Provider: Ivonne Andrew, NP Primary Care Provider: Ivonne Andrew, NP Reason for consultation: Subjective   Assessment & Plan  Diagnoses and all orders for this visit:  Uncontrolled type 1 diabetes mellitus with hyperglycemia (HCC) -     POCT glycosylated hemoglobin (Hb A1C) -     Cancel: Ambulatory referral to diabetic education -     Ambulatory referral to diabetic education  Uncontrolled type 1 diabetes mellitus with hypoglycemia without coma (HCC)  Pure hypercholesterolemia  Other orders -     Continuous Glucose Sensor (DEXCOM G7 SENSOR) MISC; 1 Device by Does not apply route continuous. -     insulin aspart (NOVOLOG) 100 UNIT/ML injection; Inject 100 Units into the skin See admin instructions. Up to 100 units per pump instructions    Diabetes complicated by hyperglycemia  Hba1c goal less than 7.0, current Hba1c is 10.1 Will recommend for the following change of medications to: Changed pump settings to control IQ  No known contraindications to any of above medications Has Glucagon   Had a focal seizure from low around 2019  Hyperlipidemia -Last LDL at goal: 84 -not on statin  -Follow low fat diet and exercise    -Blood pressure goal <140/90 - Microalbumin/creatinine goal < 30-pending lab -not on ACE/ARB  -diet changes including salt restriction -limit eating outside -counseled BP targets per standards of diabetes care -Uncontrolled blood pressure can lead to retinopathy, nephropathy and cardiovascular and atherosclerotic heart disease  Reviewed and counseled on: -A1C target -Blood sugar targets -Complications of uncontrolled diabetes  -Checking blood sugar before meals and bedtime and bring log next visit -All medications with mechanism of action and side effects -Hypoglycemia management: rule of 15's, Glucagon Emergency Kit and medical  alert ID -low-carb low-fat plate-method diet -At least 20 minutes of physical activity per day -Annual dilated retinal eye exam and foot exam -compliance and follow up needs -follow up as scheduled or earlier if problem gets worse  Call if blood sugar is less than 70 or consistently above 250    Take a 15 gm snack of carbohydrate at bedtime before you go to sleep if your blood sugar is less than 100.    If you are going to fast after midnight for a test or procedure, ask your physician for instructions on how to reduce/decrease your insulin dose.    Call if blood sugar is less than 70 or consistently above 250  -Treating a low sugar by rule of 15  (15 gms of sugar every 15 min until sugar is more than 70) If you feel your sugar is low, test your sugar to be sure If your sugar is low (less than 70), then take 15 grams of a fast acting Carbohydrate (3-4 glucose tablets or glucose gel or 4 ounces of juice or regular soda) Recheck your sugar 15 min after treating low to make sure it is more than 70 If sugar is still less than 70, treat again with 15 grams of carbohydrate          Don't drive the hour of hypoglycemia  If unconscious/unable to eat or drink by mouth, use glucagon injection or nasal spray baqsimi and call 911. Can repeat again in 15 min if still unconscious.  Return in about 13 days (around 03/22/2023) for 9:30, visit.   I have reviewed current medications, nurse's notes, allergies, vital signs, past medical  and surgical history, family medical history, and social history for this encounter. Counseled patient on symptoms, examination findings, lab findings, imaging results, treatment decisions and monitoring and prognosis. The patient understood the recommendations and agrees with the treatment plan. All questions regarding treatment plan were fully answered.  Lindsey V[place]a [place], MD  03/09/23    History of Present Illness Lindsey [name] is a 22 y.o. year old female who presents for  evaluation of Type 1 diabetes mellitus.  Lindsey [name] was first diagnosed in 2010.   Diabetes education +  Home diabetes regimen: T-slim X2 and DexCom G7  COMPLICATIONS -  MI/Stroke -  retinopathy, last eye exam 2024 -  neuropathy -  nephropathy  SYMPTOMS REVIEWED - Polyuria - Weight loss - Blurred vision  BLOOD SUGAR DATA     CGM interpretation: At today's visit, we reviewed her CGM downloads. The full report is scanned in the media. Reviewing the CGM trends, BG elevated throughout the day.  Physical Exam  BP 104/62   Pulse 74   Ht 5\' 1"  (1.549 m)   Wt 147 lb (66.7 kg)   SpO2 98%   BMI 27.78 kg/m    Constitutional: well developed, well nourished Head: normocephalic, atraumatic Eyes: sclera anicteric, no redness Neck: supple Lungs: normal respiratory effort Neurology: alert and oriented Skin: dry, no appreciable rashes Musculoskeletal: no appreciable defects Psychiatric: normal mood and affect Diabetic Foot Exam - Simple   No data filed      Current Medications Patient's Medications  New Prescriptions   CONTINUOUS GLUCOSE SENSOR (DEXCOM G7 SENSOR) MISC    1 Device by Does not apply route continuous.  Previous Medications   CONTINUOUS BLOOD GLUC SENSOR (DEXCOM G7 SENSOR) MISC    CHANGE EVERY 10 DAYS (90 DAY SUPPLY)   EPINEPHRINE 0.3 MG/0.3 ML IJ SOAJ INJECTION    Inject 0.3 mg into the muscle as needed for anaphylaxis.   INSULIN HUMAN (INSULIN PUMP) SOLN    Inject into the skin 3 times daily with meals, bedtime and 2 AM.   LAMOTRIGINE (LAMICTAL) 100 MG TABLET    Take 1 tablet (100 mg total) by mouth daily.   LEVONORGESTREL (KYLEENA) 19.5 MG IUD    as directed Intrauterine   LORATADINE (CLARITIN) 10 MG TABLET    Take 10 mg by mouth once.   PROBIOTIC PRODUCT (PROBIOTIC DAILY PO)    Take by mouth.   VENLAFAXINE XR (EFFEXOR XR) 75 MG 24 HR CAPSULE    Take with 150 mg dose for a total of 225 mg daily   VENLAFAXINE XR (EFFEXOR-XR) 150 MG 24 HR CAPSULE    Take 1  capsule (150 mg total) by mouth daily. Take with 75 mg dose for a total of 225 mg daily  Modified Medications   Modified Medication Previous Medication   INSULIN ASPART (NOVOLOG) 100 UNIT/ML INJECTION insulin aspart (NOVOLOG) 100 UNIT/ML injection      Inject 100 Units into the skin See admin instructions. Up to 100 units per pump instructions    100 Units See admin instructions.  Discontinued Medications   No medications on file    Allergies Allergies  Allergen Reactions   Justicia Adhatoda (Malabar Nut Tree) [Justicia Adhatoda] Anaphylaxis   Peanut-Containing Drug Products Anaphylaxis   Cephalosporins Other (See Comments) and Nausea And Vomiting   Penicillins Other (See Comments) and Nausea And Vomiting   Naproxen Rash and Other (See Comments)    Past Medical History Past Medical History:  Diagnosis Date   Diabetes mellitus without  complication [facility])     Past Surgical History History reviewed. No pertinent surgical history.  Family History family history includes Cancer in her maternal aunt; Depression in her mother; Diabetes in her maternal grandfather; Heart disease in her maternal grandmother and paternal grandmother; Stroke in her paternal aunt and paternal grandfather.  Social History Social History   Socioeconomic History   Marital status: Single    Spouse name: Not on file   Number of children: Not on file   Years of education: Not on file   Highest education level: Not on file  Occupational History   Not on file  Tobacco Use   Smoking status: Never   Smokeless tobacco: Never  Vaping Use   Vaping Use: Some days   Substances: Nicotine, Flavoring  Substance and Sexual Activity   Alcohol use: Yes    Comment: occ   Drug use: Yes    Types: Marijuana   Sexual activity: Yes  Other Topics Concern   Not on file  Social History Narrative   Not on file   Social Determinants of Health   Financial Resource Strain: Not on file  Food Insecurity: Not on file   Transportation Needs: Not on file  Physical Activity: Not on file  Stress: Not on file  Social Connections: Not on file  Intimate Partner Violence: Not on file    Lab Results  Component Value Date   HGBA1C 10.1 (A) 03/09/2023   Lab Results  Component Value Date   CHOL 150 04/06/2022   Lab Results  Component Value Date   HDL 59 04/06/2022   Lab Results  Component Value Date   LDLCALC 84 04/06/2022   Lab Results  Component Value Date   TRIG 28 04/06/2022   Lab Results  Component Value Date   CHOLHDL 2.5 04/06/2022   Lab Results  Component Value Date   CREATININE 0.71 06/02/2022   No results found for: "GFR" No results found for: "MICROALBUR", "MALB24HUR"    Component Value Date/Time   NA 136 06/02/2022 1304   K 3.4 (L) 06/02/2022 1304   CL 98 06/02/2022 1304   CO2 26 06/02/2022 1304   GLUCOSE 155 (H) 06/02/2022 1304   BUN 11 06/02/2022 1304   CREATININE 0.71 06/02/2022 1304   CALCIUM 9.2 06/02/2022 1304   PROT 7.5 06/02/2022 1304   ALBUMIN 4.6 06/02/2022 1304   AST 9 (L) 06/02/2022 1304   ALT 10 06/02/2022 1304   ALKPHOS 67 06/02/2022 1304   BILITOT 0.5 06/02/2022 1304   GFRNONAA >60 06/02/2022 1304      Latest Ref Rng & Units 06/02/2022    1:04 PM 03/31/2022    9:27 PM  BMP  Glucose 70 - 99 mg/dL 098  119   BUN 6 - 20 mg/dL 11  10   Creatinine 1.47 - 1.00 mg/dL 8.29  5.62   Sodium 130 - 145 mmol/L 136  136   Potassium 3.5 - 5.1 mmol/L 3.4  4.2   Chloride 98 - 111 mmol/L 98  99   CO2 22 - 32 mmol/L 26  29   Calcium 8.9 - 10.3 mg/dL 9.2  9.4        Component Value Date/Time   WBC 7.6 06/02/2022 1302   RBC 5.19 (H) 06/02/2022 1302   HGB 15.0 06/02/2022 1302   HCT 43.6 06/02/2022 1302   PLT 419 (H) 06/02/2022 1302   MCV 84.0 06/02/2022 1302   MCH 28.9 06/02/2022 1302   MCHC 34.4 06/02/2022 1302  RDW 11.8 06/02/2022 1302   LYMPHSABS 4.1 (H) 06/02/2022 1302   MONOABS 0.4 06/02/2022 1302   EOSABS 0.0 06/02/2022 1302   BASOSABS 0.0 06/02/2022  1302     Parts of this note may have been dictated using voice recognition software. There may be variances in spelling and vocabulary which are unintentional. Not all errors are proofread. Please notify the Thereasa Parkin if any discrepancies are noted or if the meaning of any statement is not clear.

## 2023-03-15 NOTE — Telephone Encounter (Signed)
Under Supplier Review

## 2023-03-15 NOTE — Telephone Encounter (Signed)
Current status: Processing

## 2023-03-16 NOTE — Telephone Encounter (Signed)
Status: Completed  Message copied and paste from [place]: Lindsey [name]  [employer] (Diabetes)  Hello,  Thank you for submitting an order request and documentation for infusion sets and cartridges. Your patient received an order on 03/27 and is not eligible per their insurance plan until 06/19. We will contact them a week prior to their eligible date. Please let us know if you have any questions or concerns.  Thank you,  Programmer, multimedia

## 2023-03-22 LAB — GAD65, IA-2, AND INSULIN AUTOANTIBODY SERUM
Glutamic Acid Decarb Ab: <5 IU/mL (ref <5)
IA-2 Antibody: >350 U/mL — ABNORMAL HIGH (ref <5.4)
Insulin Antibodies, Human: 8.3 U/mL — ABNORMAL HIGH (ref <0.4)

## 2023-03-23 ENCOUNTER — Ambulatory Visit (INDEPENDENT_AMBULATORY_CARE_PROVIDER_SITE_OTHER): Payer: No Typology Code available for payment source | Admitting: "Endocrinology

## 2023-03-23 ENCOUNTER — Encounter: Payer: Self-pay | Admitting: "Endocrinology

## 2023-03-23 VITALS — BP 118/68 | HR 100 | Ht 61.0 in | Wt 146.6 lb

## 2023-03-23 DIAGNOSIS — Z9641 Presence of insulin pump (external) (internal): Secondary | ICD-10-CM | POA: Diagnosis not present

## 2023-03-23 DIAGNOSIS — E1065 Type 1 diabetes mellitus with hyperglycemia: Secondary | ICD-10-CM | POA: Diagnosis not present

## 2023-03-23 DIAGNOSIS — E7800 Pure hypercholesterolemia, unspecified: Secondary | ICD-10-CM | POA: Diagnosis not present

## 2023-03-23 MED ORDER — HUMALOG 100 UNIT/ML IJ SOLN
INTRAMUSCULAR | 5 refills | Status: AC
Start: 2023-03-23 — End: ?

## 2023-03-23 NOTE — Progress Notes (Signed)
Outpatient Endocrinology Note [name], MD  03/23/23   Lindsey [name] [birth year] [id number]  Referring Provider: Ivonne Andrew, NP Primary Care Provider: Ivonne Andrew, NP Reason for consultation: Subjective   Assessment & Plan  Diagnoses and all orders for this visit:  Uncontrolled type 1 diabetes mellitus with hyperglycemia (HCC) -     HUMALOG 100 UNIT/ML injection; Use up to 100 units with pump as needed  Insulin pump in place  Pure hypercholesterolemia    Diabetes complicated by hyperglycemia  Hba1c goal less than 7.0, current Hba1c is 10.1 Will recommend for the following change of medications to:  Changed pump settings to control IQ: encouraged to keep sensor 100% 12AM 1.2 basal, ISF 1:35>110, IC 1:8 6AM 1.8 basal, ISF 1:35>110, IC 1:8  Sleep time: 10PM-6AM  No known contraindications to any of above medications Has Glucagon  Has back up lantus  Had a focal seizure from low around 2019  Hyperlipidemia -Last LDL at goal: 84 -not on statin  -Follow low fat diet and exercise    -Blood pressure goal <140/90 - Microalbumin/creatinine goal < 30-pending lab -not on ACE/ARB  -diet changes including salt restriction -limit eating outside -counseled BP targets per standards of diabetes care -Uncontrolled blood pressure can lead to retinopathy, nephropathy and cardiovascular and atherosclerotic heart disease  Reviewed and counseled on: -A1C target -Blood sugar targets -Complications of uncontrolled diabetes  -Checking blood sugar before meals and bedtime and [name] log next visit -All medications with mechanism of action and side effects -Hypoglycemia management: rule of 15's, Glucagon Emergency Kit and medical alert ID -low-carb low-fat plate-method diet -At least 20 minutes of physical activity per day -Annual dilated retinal eye exam and foot exam -compliance and follow up needs -follow up as scheduled or earlier if problem gets  worse  Call if blood sugar is less than 70 or consistently above 250    Take a 15 gm snack of carbohydrate at bedtime before you go to sleep if your blood sugar is less than 100.    If you are going to fast after midnight for a test or procedure, ask your physician for instructions on how to reduce/decrease your insulin dose.    Call if blood sugar is less than 70 or consistently above 250  -Treating a low sugar by rule of 15  (15 gms of sugar every 15 min until sugar is more than 70) If you feel your sugar is low, test your sugar to be sure If your sugar is low (less than 70), then take 15 grams of a fast acting Carbohydrate (3-4 glucose tablets or glucose gel or 4 ounces of juice or regular soda) Recheck your sugar 15 min after treating low to make sure it is more than 70 If sugar is still less than 70, treat again with 15 grams of carbohydrate          Don't drive the hour of hypoglycemia  If unconscious/unable to eat or drink by mouth, use glucagon injection or nasal spray baqsimi and call 911. Can repeat again in 15 min if still unconscious.  Return in about 4 weeks (around 04/20/2023) for visit.   I have reviewed current medications, nurse's notes, allergies, vital signs, past medical and surgical history, family medical history, and social history for this encounter. Counseled patient on symptoms, examination findings, lab findings, imaging results, treatment decisions and monitoring and prognosis. The patient understood the recommendations and agrees with the treatment plan. All questions regarding  treatment plan were fully answered.  Altamese [place], MD  03/23/23    History of Present Illness Lindsey [name] is a 22 y.o. year old female who presents for follow up on Type 1 diabetes mellitus.  Faviola Demeter was first diagnosed in 2010.   Diabetes education +  Home diabetes regimen: T-slim X2 and DexCom G7 Humalog insulin    COMPLICATIONS -  MI/Stroke -  retinopathy, last eye  exam 2024 -  neuropathy -  nephropathy  SYMPTOMS REVIEWED - Polyuria - Weight loss - Blurred vision  BLOOD SUGAR DATA        CGM interpretation: At today's visit, we reviewed her CGM downloads. The full report is scanned in the media. Reviewing the CGM trends, BG elevated throughout the day.  Physical Exam  BP 118/68   Pulse 100   Ht 5\' 1"  (1.549 m)   Wt 146 lb 9.6 oz (66.5 kg)   SpO2 98%   BMI 27.70 kg/m    Constitutional: well developed, well nourished Head: normocephalic, atraumatic Eyes: sclera anicteric, no redness Neck: supple Lungs: normal respiratory effort Neurology: alert and oriented Skin: dry, no appreciable rashes Musculoskeletal: no appreciable defects Psychiatric: normal mood and affect   Current Medications Patient's Medications  New Prescriptions   No medications on file  Previous Medications   CONTINUOUS BLOOD GLUC SENSOR (DEXCOM G7 SENSOR) MISC    CHANGE EVERY 10 DAYS (90 DAY SUPPLY)   CONTINUOUS GLUCOSE SENSOR (DEXCOM G7 SENSOR) MISC    1 Device by Does not apply route continuous.   EPINEPHRINE 0.3 MG/0.3 ML IJ SOAJ INJECTION    Inject 0.3 mg into the muscle as needed for anaphylaxis.   INSULIN HUMAN (INSULIN PUMP) SOLN    Inject into the skin 3 times daily with meals, bedtime and 2 AM.   LAMOTRIGINE (LAMICTAL) 100 MG TABLET    Take 1 tablet (100 mg total) by mouth daily.   LEVONORGESTREL (KYLEENA) 19.5 MG IUD    as directed Intrauterine   LORATADINE (CLARITIN) 10 MG TABLET    Take 10 mg by mouth once.   PROBIOTIC PRODUCT (PROBIOTIC DAILY PO)    Take by mouth.   VENLAFAXINE XR (EFFEXOR XR) 75 MG 24 HR CAPSULE    Take with 150 mg dose for a total of 225 mg daily   VENLAFAXINE XR (EFFEXOR-XR) 150 MG 24 HR CAPSULE    Take 1 capsule (150 mg total) by mouth daily. Take with 75 mg dose for a total of 225 mg daily  Modified Medications   Modified Medication Previous Medication   HUMALOG 100 UNIT/ML INJECTION HUMALOG 100 UNIT/ML injection      Use  up to 100 units with pump as needed    SMARTSIG:100 Unit(s) SUB-Q Daily  Discontinued Medications   INSULIN ASPART (NOVOLOG) 100 UNIT/ML INJECTION    Inject 100 Units into the skin See admin instructions. Up to 100 units per pump instructions    Allergies Allergies  Allergen Reactions   Justicia Adhatoda (Malabar Nut Tree) [Justicia Adhatoda] Anaphylaxis   Peanut-Containing Drug Products Anaphylaxis   Cephalosporins Other (See Comments) and Nausea And Vomiting   Penicillins Other (See Comments) and Nausea And Vomiting   Naproxen Rash and Other (See Comments)    Past Medical History Past Medical History:  Diagnosis Date   Diabetes mellitus without complication (HCC)     Past Surgical History History reviewed. No pertinent surgical history.  Family History family history includes Cancer in her maternal aunt; Depression in her mother;  Diabetes in her maternal grandfather; Heart disease in her maternal grandmother and paternal grandmother; Stroke in her paternal aunt and paternal grandfather.  Social History Social History   Socioeconomic History   Marital status: Single    Spouse name: Not on file   Number of children: Not on file   Years of education: Not on file   Highest education level: Not on file  Occupational History   Not on file  Tobacco Use   Smoking status: Never   Smokeless tobacco: Never  Vaping Use   Vaping Use: Some days   Substances: Nicotine, Flavoring  Substance and Sexual Activity   Alcohol use: Yes    Comment: occ   Drug use: Yes    Types: Marijuana   Sexual activity: Yes  Other Topics Concern   Not on file  Social History Narrative   Not on file   Social Determinants of Health   Financial Resource Strain: Not on file  Food Insecurity: Not on file  Transportation Needs: Not on file  Physical Activity: Not on file  Stress: Not on file  Social Connections: Not on file  Intimate Partner Violence: Not on file    Lab Results  Component  Value Date   HGBA1C 9.9 (H) 03/09/2023   HGBA1C 10.1 (A) 03/09/2023   Lab Results  Component Value Date   CHOL 154 03/09/2023   Lab Results  Component Value Date   HDL 63.90 03/09/2023   Lab Results  Component Value Date   LDLCALC 82 03/09/2023   Lab Results  Component Value Date   TRIG 43.0 03/09/2023   Lab Results  Component Value Date   CHOLHDL 2 03/09/2023   Lab Results  Component Value Date   CREATININE 0.64 03/09/2023   Lab Results  Component Value Date   GFR 124.72 03/09/2023   No results found for: "MICROALBUR", "MALB24HUR"    Component Value Date/Time   NA 138 03/09/2023 1032   K 4.4 03/09/2023 1032   CL 101 03/09/2023 1032   CO2 27 03/09/2023 1032   GLUCOSE 283 (H) 03/09/2023 1032   BUN 7 03/09/2023 1032   CREATININE 0.64 03/09/2023 1032   CALCIUM 9.2 03/09/2023 1032   PROT 6.2 03/09/2023 1032   ALBUMIN 3.9 03/09/2023 1032   AST 10 03/09/2023 1032   ALT 11 03/09/2023 1032   ALKPHOS 65 03/09/2023 1032   BILITOT 0.5 03/09/2023 1032   GFRNONAA >60 06/02/2022 1304      Latest Ref Rng & Units 03/09/2023   10:32 AM 06/02/2022    1:04 PM 03/31/2022    9:27 PM  BMP  Glucose 70 - 99 mg/dL 161  096  045   BUN 6 - 23 mg/dL 7  11  10    Creatinine 0.40 - 1.20 mg/dL 4.09  8.11  9.14   Sodium 135 - 145 mEq/L 138  136  136   Potassium 3.5 - 5.1 mEq/L 4.4  3.4  4.2   Chloride 96 - 112 mEq/L 101  98  99   CO2 19 - 32 mEq/L 27  26  29    Calcium 8.4 - 10.5 mg/dL 9.2  9.2  9.4        Component Value Date/Time   WBC 7.6 06/02/2022 1302   RBC 5.19 (H) 06/02/2022 1302   HGB 15.0 06/02/2022 1302   HCT 43.6 06/02/2022 1302   PLT 419 (H) 06/02/2022 1302   MCV 84.0 06/02/2022 1302   MCH 28.9 06/02/2022 1302   MCHC  34.4 06/02/2022 1302   RDW 11.8 06/02/2022 1302   LYMPHSABS 4.1 (H) 06/02/2022 1302   MONOABS 0.4 06/02/2022 1302   EOSABS 0.0 06/02/2022 1302   BASOSABS 0.0 06/02/2022 1302     Parts of this note may have been dictated using voice recognition  software. There may be variances in spelling and vocabulary which are unintentional. Not all errors are proofread. Please notify the Thereasa Parkin if any discrepancies are noted or if the meaning of any statement is not clear.

## 2023-03-26 ENCOUNTER — Encounter: Payer: Self-pay | Admitting: "Endocrinology

## 2023-03-30 ENCOUNTER — Telehealth (HOSPITAL_BASED_OUTPATIENT_CLINIC_OR_DEPARTMENT_OTHER): Payer: No Typology Code available for payment source | Admitting: Psychiatry

## 2023-03-30 ENCOUNTER — Encounter (HOSPITAL_COMMUNITY): Payer: Self-pay | Admitting: Psychiatry

## 2023-03-30 DIAGNOSIS — F4310 Post-traumatic stress disorder, unspecified: Secondary | ICD-10-CM

## 2023-03-30 DIAGNOSIS — F319 Bipolar disorder, unspecified: Secondary | ICD-10-CM

## 2023-03-30 MED ORDER — VENLAFAXINE HCL ER 75 MG PO CP24
ORAL_CAPSULE | ORAL | 1 refills | Status: DC
Start: 2023-03-30 — End: 2023-09-28

## 2023-03-30 MED ORDER — LAMOTRIGINE 100 MG PO TABS
100.0000 mg | ORAL_TABLET | Freq: Every day | ORAL | 1 refills | Status: DC
Start: 2023-03-30 — End: 2023-09-28

## 2023-03-30 MED ORDER — VENLAFAXINE HCL ER 150 MG PO CP24
150.0000 mg | ORAL_CAPSULE | Freq: Every day | ORAL | 1 refills | Status: DC
Start: 2023-03-30 — End: 2023-09-14

## 2023-03-30 NOTE — Progress Notes (Signed)
BH MD/PA/NP OP Progress Note  03/30/2023 10:53 AM Lindsey [name]  MRN:  [id number]  Visit Diagnosis:    ICD-10-CM   1. Bipolar 1 disorder (HCC)  F31.9 lamoTRIgine (LAMICTAL) 100 MG tablet    2. PTSD (post-traumatic stress disorder)  F43.10 venlafaxine XR (EFFEXOR XR) 75 MG 24 hr capsule    venlafaxine XR (EFFEXOR-XR) 150 MG 24 hr capsule       Assessment: Lindsey [name] is a 22 y.o. female with a history of Bipolar disorder, PTSD, and GAD who presented to [facility] for initial evaluation on 06/12/22.  At initial evaluation patient reported being diagnosed with GAD, PTSD, and bipolar disorder as a teenager.  She endorsed a history of symptoms of manic-like behaviors including promiscuity, impulsiveness, auditory hallucinations (primarily during periods of increased stress), decreased need for sleep for about a week at a time, and irritability.  Patient denied substance use contributing to this manic episode and reports the last manic episode was 5 years ago.  Patient has had depressive episodes more recently where she experiences decreased energy, fatigue, anhedonia, amotivation, and hypersomnia.  These episodes can progressed to the point of passive and active SI and patient tried to commit suicide via overdose in 2020.  She was hospitalized at that time and her medication was titrated.  Patient also endorsed symptoms of PTSD secondary to past trauma with symptoms of nightmares, flashbacks, and dissociations which have decreased in frequency over the last few years.  Patient meets criteria for a diagnosis of PTSD and GAD.  While the diagnosis of bipolar disorder is a little bit less certain patient does note her mother also has a history of manic episodes and hallucinations.  We discussed the risk of antidepressant medications on triggering a manic state which patient is aware and reports that she has not had a manic state since starting the venlafaxine 5 years ago.    Lindsey [name]  presents for follow-up evaluation. Today, 03/30/23, patient reports that her moods have been stable.  She is to go to therapy every few weeks.  Patient takes medication consistently and denies any adverse side effects.  Sleep issues she had been reporting previously have improved since she got a dog to take care of.  We will continue on her current regimen and follow-up in 6 months.  Plan: - Continue Lamictal 100 mg QD - Continue venlafaxine 150 capsule and 75 capsule for 225 mg QD - CMP, CBC, lipid profile, and A1c reviewed - Continue with therapist every 3 weeks - Follow up in 6 months   Chief Complaint:  Chief Complaint  Patient presents with   Follow-up   HPI: Lindsey [name] presents reporting that the last 3 months have good. 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 She has a new puppy and is hoping to train her to be a diabetic alert dog.  Lindsey [name] notes that she has been chronically hyperglycemic for a long time. Had to stop seeing the provider due to the move. Her new endocrinologist has been making a number of changes.  This can be stressful and difficult, as she is having more episodes of hypoglycemia.  However she does think it would be worth it in the long run.  In regards to her mood symptoms patient reports that has been fairly stable.  She still seeing her therapist every few weeks and finds this helpful to keep things on track.  The issues with excessive sleep and amotivation  have improved ever since she got her dog.  While there are some days that she is more fatigued she thinks this is more related to the glucose levels.  Patient denies any adverse side effects from her current medication regimen we did review these and potential effects that could relate to her diabetes issues.  The only potential side effect from the medication could be weight gain secondary to venlafaxine.  Lindsey [name] however notes that there have been no change in her weight while on  this medication in the past 2 years.  Past Psychiatric History: Patient reports a past psychiatric history of bipolar disorder, general anxiety, and PTSD. She notes that she has had suicidal thoughts in the past with one prior attempt in 2020 by overdose and been hospitalized in 2015, 2017, and 2020. Patient has been on a psychiatric regimen of Lamictal 100  gm QD and venlafaxine 225 mg QD with good management of her symptoms.  She reports that she was also on Abilify in the past which was stopped due to over sedation and negative effects on her diabetes.  Past Medical History:  Past Medical History:  Diagnosis Date   Diabetes mellitus without complication (HCC)    No past surgical history on file.  Family Psychiatric History: Patient reports a past psychiatric history of opiate use disorder in her father and subsequent overdose on fentanyl.  She also endorsed anxiety and depressive issues on her mother's side with her mother experiencing bipolar/manic symptoms in her 53s as well.  Family History:  Family History  Problem Relation Age of Onset   Depression Mother    Cancer Maternal Aunt    Stroke Paternal Aunt    Heart disease Maternal Grandmother    Diabetes Maternal Grandfather    Heart disease Paternal Grandmother    Stroke Paternal Grandfather     Social History:  Social History   Socioeconomic History   Marital status: Single    Spouse name: Not on file   Number of children: Not on file   Years of education: Not on file   Highest education level: Not on file  Occupational History   Not on file  Tobacco Use   Smoking status: Never   Smokeless tobacco: Never  Vaping Use   Vaping Use: Some days   Substances: Nicotine, Flavoring  Substance and Sexual Activity   Alcohol use: Yes    Comment: occ   Drug use: Yes    Types: Marijuana   Sexual activity: Yes  Other Topics Concern   Not on file  Social History Narrative   Not on file   Social Determinants of Health    Financial Resource Strain: Not on file  Food Insecurity: Not on file  Transportation Needs: Not on file  Physical Activity: Not on file  Stress: Not on file  Social Connections: Not on file    Allergies:  Allergies  Allergen Reactions   Justicia Adhatoda (Malabar Nut Tree) [Justicia Adhatoda] Anaphylaxis   Peanut-Containing Drug Products Anaphylaxis   Cephalosporins Other (See Comments) and Nausea And Vomiting   Penicillins Other (See Comments) and Nausea And Vomiting   Naproxen Rash and Other (See Comments)    Current Medications: Current Outpatient Medications  Medication Sig Dispense Refill   Continuous Blood Gluc Sensor (DEXCOM G7 SENSOR) MISC CHANGE EVERY 10 DAYS (90 DAY SUPPLY)     Continuous Glucose Sensor (DEXCOM G7 SENSOR) MISC 1 Device by Does not apply route continuous. 9 each 0   EPINEPHrine 0.3 mg/0.3  mL IJ SOAJ injection Inject 0.3 mg into the muscle as needed for anaphylaxis. 1 each 0   HUMALOG 100 UNIT/ML injection Use up to 100 units with pump as needed 30 mL 5   Insulin Human (INSULIN PUMP) SOLN Inject into the skin 3 times daily with meals, bedtime and 2 AM.     lamoTRIgine (LAMICTAL) 100 MG tablet Take 1 tablet (100 mg total) by mouth daily. 90 tablet 1   levonorgestrel (KYLEENA) 19.5 MG IUD as directed Intrauterine     loratadine (CLARITIN) 10 MG tablet Take 10 mg by mouth once.     Probiotic Product (PROBIOTIC DAILY PO) Take by mouth.     venlafaxine XR (EFFEXOR XR) 75 MG 24 hr capsule Take with 150 mg dose for a total of 225 mg daily 90 capsule 1   venlafaxine XR (EFFEXOR-XR) 150 MG 24 hr capsule Take 1 capsule (150 mg total) by mouth daily. Take with 75 mg dose for a total of 225 mg daily 90 capsule 1   No current facility-administered medications for this visit.     Psychiatric Specialty Exam: Review of Systems  There were no vitals taken for this visit.There is no height or weight on file to calculate BMI.  General Appearance: Well Groomed  Eye  Contact:  Good  Speech:  Clear and Coherent  Volume:  Normal  Mood:  Euthymic  Affect:  Congruent  Thought Process:  Coherent  Orientation:  Full (Time, Place, and Person)  Thought Content: Logical   Suicidal Thoughts:  No  Homicidal Thoughts:  No  Memory:  Immediate;   Good  Judgement:  Good  Insight:  Good  Psychomotor Activity:  Normal  Concentration:  Concentration: Good  Recall:  Good  Fund of Knowledge: Good  Language: Good  Akathisia:  NA    AIMS (if indicated): not done  Assets:  Architect Physical Health Resilience  ADL's:  Intact  Cognition: WNL  Sleep:  Good   Metabolic Disorder Labs: Lab Results  Component Value Date   HGBA1C 9.9 (H) 03/09/2023   No results found for: "PROLACTIN" Lab Results  Component Value Date   CHOL 154 03/09/2023   TRIG 43.0 03/09/2023   HDL 63.90 03/09/2023   CHOLHDL 2 03/09/2023   VLDL 8.6 03/09/2023   LDLCALC 82 03/09/2023   LDLCALC 84 04/06/2022   No results found for: "TSH"  Therapeutic Level Labs: No results found for: "LITHIUM" No results found for: "VALPROATE" No results found for: "CBMZ"   Screenings: PHQ2-9    Flowsheet Row Office Visit from 06/13/2022 in [facility]  PHQ-2 Total Score 1      Flowsheet Row Office Visit from 06/13/2022 in [facility] ED from 06/02/2022 in [facility] Emergency Department at [facility] ED from 03/31/2022 in [facility] Emergency Department at [facility]  C-SSRS RISK CATEGORY No Risk No Risk No Risk       Collaboration of Care: Collaboration of Care: Medication Management AEB medication prescription, Primary Care Provider AEB chart review, and Other provider involved in patient's care AEB endocrinology chart review  Patient/Guardian was advised Release of Information must be obtained prior to any record release in order to collaborate their  care with an outside provider. Patient/Guardian was advised if they have not already done so to contact the registration department to sign all necessary forms in order for [place] to release information regarding their care.   Consent: Patient/Guardian gives  verbal consent for treatment and assignment of benefits for services provided during this visit. Patient/Guardian expressed understanding and agreed to proceed.    Stasia Cavalier, MD 03/30/2023, 10:53 AM   Virtual Visit via Video Note  I connected with Lindsey [name] on 03/30/23 at 10:30 AM EDT by a video enabled telemedicine application and verified that I am speaking with the correct person using two identifiers.  Location: Patient: Home Provider: Home Office   I discussed the limitations of evaluation and management by telemedicine and the availability of in person appointments. The patient expressed understanding and agreed to proceed.   I discussed the assessment and treatment plan with the patient. The patient was provided an opportunity to ask questions and all were answered. The patient agreed with the plan and demonstrated an understanding of the instructions.   The patient was advised to call back or seek an in-person evaluation if the symptoms worsen or if the condition fails to improve as anticipated.  I provided 15 minutes of non-face-to-face time during this encounter.   Stasia Cavalier, MD

## 2023-04-27 ENCOUNTER — Ambulatory Visit: Payer: No Typology Code available for payment source | Admitting: "Endocrinology

## 2023-05-20 ENCOUNTER — Encounter: Payer: Self-pay | Admitting: "Endocrinology

## 2023-05-27 ENCOUNTER — Telehealth: Payer: No Typology Code available for payment source

## 2023-05-30 ENCOUNTER — Other Ambulatory Visit: Payer: Self-pay | Admitting: "Endocrinology

## 2023-07-13 ENCOUNTER — Ambulatory Visit: Payer: No Typology Code available for payment source | Admitting: "Endocrinology

## 2023-09-13 ENCOUNTER — Other Ambulatory Visit (HOSPITAL_COMMUNITY): Payer: Self-pay | Admitting: Psychiatry

## 2023-09-13 DIAGNOSIS — F4310 Post-traumatic stress disorder, unspecified: Secondary | ICD-10-CM

## 2023-09-20 ENCOUNTER — Telehealth: Payer: Self-pay

## 2023-09-20 NOTE — Transitions of Care (Post Inpatient/ED Visit) (Signed)
09/20/2023  Name: Lindsey [name] MRN: [id number] DOB: December 20, 2000  Today's TOC FU Call Status: Today's TOC FU Call Status:: Successful TOC FU Call Completed Unsuccessful Call (1st Attempt) Date: 09/20/23 Patient's Name and Date of Birth confirmed.  Transition Care Management Follow-up Telephone Call Date of Discharge: 09/19/23 Discharge Facility: Other ([facility]) Name of Other ([facility]) Discharge Facility: [facility] Type of Discharge: Emergency Department Reason for ED Visit: Respiratory (anaphylactic shock initial encounter) Respiratory Diagnosis:  (anaphylactic shock unspecified, initial encounter) How have you been since you were released from the hospital?: Better Any questions or concerns?: No  Items Reviewed: Did you receive and understand the discharge instructions provided?: Yes Medications obtained,verified, and reconciled?: Yes (Medications Reviewed) Any new allergies since your discharge?: No Dietary orders reviewed?: NA Do you have support at home?: Yes  Medications Reviewed Today: Medications Reviewed Today     Reviewed by Nhyira Leano, Jordan Hawks, CMA (Certified Medical Assistant) on 09/20/23 at 1503  Med List Status: <None>   Medication Order Taking? Sig Documenting Provider Last Dose Status Informant  Continuous Blood Gluc Sensor (DEXCOM G7 SENSOR) MISC [id number] Yes CHANGE EVERY 10 DAYS (90 DAY SUPPLY) [provider] Taking Active   Continuous Glucose Sensor (DEXCOM G7 SENSOR) MISC [id number] Yes 1 Device by Does not apply route continuous. Altamese [place]al, MD Taking Active   Continuous Glucose Sensor (DEXCOM G7 SENSOR) MISC [id number] Yes USE 1 SENSOR CONTINUOUSLY Motwani, Komal, MD Taking Active   EPINEPHrine 0.3 mg/0.3 mL IJ SOAJ injection [id number] Yes Inject 0.3 mg into the muscle as needed for anaphylaxis. Henderly, Britni A, PA-C Taking Active   HUMALOG 100 UNIT/ML injection [id number] Yes Use up to 100 units with pump as needed Altamese [place],  MD Taking Active   Insulin Human (INSULIN PUMP) SOLN [id number] Yes Inject into the skin 3 times daily with meals, bedtime and 2 AM. [provider] Taking Active   lamoTRIgine (LAMICTAL) 100 MG tablet [id number] Yes Take 1 tablet (100 mg total) by mouth daily. Stasia Cavalier, MD Taking Active   levonorgestrel [facility]) 19.5 MG IUD [id number] Yes as directed Intrauterine [provider] Taking Active   loratadine (CLARITIN) 10 MG tablet [id number] Yes Take 10 mg by mouth once. [provider] Taking Active   Probiotic Product (PROBIOTIC DAILY PO) [id number] Yes Take by mouth. [provider] Taking Active   venlafaxine XR (EFFEXOR XR) 75 MG 24 hr capsule [id number] Yes Take with 150 mg dose for a total of 225 mg daily Stasia Cavalier, MD Taking Active   venlafaxine XR (EFFEXOR-XR) 150 MG 24 hr capsule [id number] Yes TAKE 1 CAPSULE WITH 75 MG DOSE FOR A TOTAL OF 225 MG DAILY. MUST KEEP APPOINTMENT FOR FUTURE REFILLS Stasia Cavalier, MD Taking Active             Home Care and Equipment/Supplies: Were Home Health Services Ordered?: NA Any new equipment or medical supplies ordered?: NA  Functional Questionnaire: Do you need assistance with bathing/showering or dressing?: No Do you need assistance with meal preparation?: No Do you need assistance with eating?: No Do you have difficulty maintaining continence: No Do you need assistance with getting out of bed/getting out of a chair/moving?: No Do you have difficulty managing or taking your medications?: No  Follow up appointments reviewed: PCP Follow-up appointment confirmed?: Yes Date of PCP follow-up appointment?: 09/25/23 Follow-up Provider: [facility] Follow-up appointment confirmed?: NA Do you need transportation to your follow-up appointment?: No Do you understand care  options if your condition(s) worsen?: Yes-patient verbalized understanding    Abby Olivene Cookston, CMA  [facility] AWV  Team Direct Dial: [phone]

## 2023-09-20 NOTE — Transitions of Care (Post Inpatient/ED Visit) (Signed)
   09/20/2023  Name: Lindsey [name] MRN: [id number] DOB: 29-May-2001  Today's TOC FU Call Status: Today's TOC FU Call Status:: Unsuccessful Call (1st Attempt) Unsuccessful Call (1st Attempt) Date: 09/20/23  Attempted to reach the patient regarding the most recent Inpatient/ED visit.  Follow Up Plan: Additional outreach attempts will be made to reach the patient to complete the Transitions of Care (Post Inpatient/ED visit) call.   Abby Lessie Manigo, CMA  [facility] AWV Team Direct Dial: [phone]

## 2023-09-24 NOTE — Progress Notes (Signed)
BH MD/PA/NP OP Progress Note  09/28/2023 10:49 AM Lindsey [name]  MRN:  [id number]  Visit Diagnosis:    ICD-10-CM   1. Bipolar 1 disorder (HCC)  F31.9 lamoTRIgine (LAMICTAL) 100 MG tablet    2. PTSD (post-traumatic stress disorder)  F43.10 venlafaxine XR (EFFEXOR XR) 75 MG 24 hr capsule    venlafaxine XR (EFFEXOR-XR) 150 MG 24 hr capsule      Assessment: Lindsey [name] is a 22 y.o. female with a history of Bipolar disorder, PTSD, and GAD who presented to [facility] for initial evaluation on 06/12/22.  At initial evaluation patient reported being diagnosed with GAD, PTSD, and bipolar disorder as a teenager.  She endorsed a history of symptoms of manic-like behaviors including promiscuity, impulsiveness, auditory hallucinations (primarily during periods of increased stress), decreased need for sleep for about a week at a time, and irritability.  Patient denied substance use contributing to this manic episode and reports the last manic episode was 5 years ago.  Patient has had depressive episodes more recently where she experiences decreased energy, fatigue, anhedonia, amotivation, and hypersomnia.  These episodes can progressed to the point of passive and active SI and patient tried to commit suicide via overdose in 2020.  She was hospitalized at that time and her medication was titrated.  Patient also endorsed symptoms of PTSD secondary to past trauma with symptoms of nightmares, flashbacks, and dissociations which have decreased in frequency over the last few years.  Patient meets criteria for a diagnosis of PTSD and GAD.  While the diagnosis of bipolar disorder is a little bit less certain patient does note her mother also has a history of manic episodes and hallucinations.  We discussed the risk of antidepressant medications on triggering a manic state which patient is aware and reports that she has not had a manic state since starting the venlafaxine 5 years ago.    Lindsey [name]  presents for follow-up evaluation. Today, 09/28/23, patient reports that her mood has been stable despite a number of somatic issues.  She is also connected with a new therapist and is working on her with EMDR.  Takes her medications consistently with no clinical side effects.  She had been tachycardic in the emergency room though had received epinephrine prior.  Patient believes that she typically is in a tachycardic range however.  She will continue to monitor this over the next 6 months that she has a number of other doctors appointments.  If this continues we can assess whether adjustments to venlafaxine would be indicated.  For now we will continue on her current regimen and follow-up in 6 months.  Plan: - Continue Lamictal 100 mg QD - Continue venlafaxine 150 capsule and 75 capsule for 225 mg QD - CMP, CBC, lipid profile, and A1c reviewed - Continue with therapist every 3 weeks - Follow up in 6 months  Chief Complaint:  Chief Complaint  Patient presents with   Follow-up   HPI: Lindsey [name] presents reporting that the last 6 months have been alright from a mental health standpoint.  She has been having a lot going on with her physical health including continued struggles with being hypoglycemic and an anaphylactic reaction.  Despite this though she still been able to go about her day and is done well with both work and school.  She is set to finish the semester with good grades and will be starting up again in the spring.  Patient also notes that she is connected with a  new therapist who is working with her on EMDR which she finds helpful.  Lindsey [name] continues to take her medications consistently and denies any notable adverse side effects.  She did ask about whether venlafaxine can affect heart rate as she had someone mention it while she was in the emergency room.  At that time patient's heart rate was elevated in the 140s though she had just received epinephrine.  Lindsey [name] reports that her heart rate  typically is above 100 however.  We did discuss that venlafaxine can affect heart rate and we can monitor this.  While she has no clinical symptoms that are concerning if it does remain elevated over the next 6 months we can discuss whether we want to taper the dose or not.  Patient was agreeable to this and noted some hesitancy about making changes as she has been stable on this medication for an extended period.  Past Psychiatric History: Patient reports a past psychiatric history of bipolar disorder, general anxiety, and PTSD. She notes that she has had suicidal thoughts in the past with one prior attempt in 2020 by overdose and been hospitalized in 2015, 2017, and 2020. Patient has been on a psychiatric regimen of Lamictal 100  gm QD and venlafaxine 225 mg QD with good management of her symptoms.  She reports that she was also on Abilify in the past which was stopped due to over sedation and negative effects on her diabetes.  Past Medical History:  Past Medical History:  Diagnosis Date   Diabetes mellitus without complication (HCC)    History reviewed. No pertinent surgical history.  Family Psychiatric History: Patient reports a past psychiatric history of opiate use disorder in her father and subsequent overdose on fentanyl.  She also endorsed anxiety and depressive issues on her mother's side with her mother experiencing bipolar/manic symptoms in her 73s as well.  Family History:  Family History  Problem Relation Age of Onset   Depression Mother    Cancer Maternal Aunt    Stroke Paternal Aunt    Heart disease Maternal Grandmother    Diabetes Maternal Grandfather    Heart disease Paternal Grandmother    Stroke Paternal Grandfather     Social History:  Social History   Socioeconomic History   Marital status: Single    Spouse name: Not on file   Number of children: Not on file   Years of education: Not on file   Highest education level: Not on file  Occupational History   Not on  file  Tobacco Use   Smoking status: Never   Smokeless tobacco: Never  Vaping Use   Vaping status: Some Days   Substances: Nicotine, Flavoring  Substance and Sexual Activity   Alcohol use: Yes    Comment: occ   Drug use: Yes    Types: Marijuana   Sexual activity: Yes  Other Topics Concern   Not on file  Social History Narrative   Not on file   Social Drivers of Health   Financial Resource Strain: Not on file  Food Insecurity: Not on file  Transportation Needs: Not on file  Physical Activity: Not on file  Stress: Not on file  Social Connections: Not on file    Allergies:  Allergies  Allergen Reactions   Justicia Adhatoda (Malabar Nut Tree) [Justicia Adhatoda] Anaphylaxis   Peanut-Containing Drug Products Anaphylaxis   Cephalosporins Other (See Comments) and Nausea And Vomiting   Penicillins Other (See Comments) and Nausea And Vomiting   Naproxen Rash  and Other (See Comments)    Current Medications: Current Outpatient Medications  Medication Sig Dispense Refill   Continuous Blood Gluc Sensor (DEXCOM G7 SENSOR) MISC CHANGE EVERY 10 DAYS (90 DAY SUPPLY)     Continuous Glucose Sensor (DEXCOM G7 SENSOR) MISC 1 Device by Does not apply route continuous. 9 each 0   Continuous Glucose Sensor (DEXCOM G7 SENSOR) MISC USE 1 SENSOR CONTINUOUSLY 9 each 3   EPINEPHrine 0.3 mg/0.3 mL IJ SOAJ injection Inject 0.3 mg into the muscle as needed for anaphylaxis. 1 each 0   HUMALOG 100 UNIT/ML injection Use up to 100 units with pump as needed 30 mL 5   Insulin Human (INSULIN PUMP) SOLN Inject into the skin 3 times daily with meals, bedtime and 2 AM.     lamoTRIgine (LAMICTAL) 100 MG tablet Take 1 tablet (100 mg total) by mouth daily. 90 tablet 1   levonorgestrel (KYLEENA) 19.5 MG IUD as directed Intrauterine     loratadine (CLARITIN) 10 MG tablet Take 10 mg by mouth once. (Patient not taking: Reported on 09/25/2023)     Probiotic Product (PROBIOTIC DAILY PO) Take by mouth.     venlafaxine  XR (EFFEXOR XR) 75 MG 24 hr capsule Take with 150 mg dose for a total of 225 mg daily 90 capsule 1   venlafaxine XR (EFFEXOR-XR) 150 MG 24 hr capsule Take 1 capsule (150 mg total) by mouth daily. Take with 75 mg tab for a total of 225 mg daily 90 capsule 1   No current facility-administered medications for this visit.     Psychiatric Specialty Exam: Review of Systems  There were no vitals taken for this visit.There is no height or weight on file to calculate BMI.  General Appearance: Well Groomed  Eye Contact:  Good  Speech:  Clear and Coherent  Volume:  Normal  Mood:  Euthymic  Affect:  Congruent  Thought Process:  Coherent  Orientation:  Full (Time, Place, and Person)  Thought Content: Logical   Suicidal Thoughts:  No  Homicidal Thoughts:  No  Memory:  Immediate;   Good  Judgement:  Good  Insight:  Good  Psychomotor Activity:  Normal  Concentration:  Concentration: Good  Recall:  Good  Fund of Knowledge: Good  Language: Good  Akathisia:  NA    AIMS (if indicated): not done  Assets:  Architect Physical Health Resilience  ADL's:  Intact  Cognition: WNL  Sleep:  Good   Metabolic Disorder Labs: Lab Results  Component Value Date   HGBA1C 9.9 (H) 03/09/2023   No results found for: "PROLACTIN" Lab Results  Component Value Date   CHOL 154 03/09/2023   TRIG 43.0 03/09/2023   HDL 63.90 03/09/2023   CHOLHDL 2 03/09/2023   VLDL 8.6 03/09/2023   LDLCALC 82 03/09/2023   LDLCALC 84 04/06/2022   No results found for: "TSH"  Therapeutic Level Labs: No results found for: "LITHIUM" No results found for: "VALPROATE" No results found for: "CBMZ"   Screenings: PHQ2-9    Flowsheet Row Office Visit from [facility]/13/20[facility] in [facility]  PHQ-2 Total Score 1      Flowsheet Row Office Visit from 06/13/2022 in [facility] ED from [facility]02/2022 in [facility]  Emergency Department at [facility] ED from 03/31/2022 in [facility] Emergency Department at [facility]  C-SSRS RISK CATEGORY No Risk No Risk No Risk       Collaboration of Care: Collaboration of  Care: Medication Management AEB medication prescription and Other provider involved in patient's care AEB ED chart review  Patient/Guardian was advised Release of Information must be obtained prior to any record release in order to collaborate their care with an outside provider. Patient/Guardian was advised if they have not already done so to contact the registration department to sign all necessary forms in order for [place] to release information regarding their care.   Consent: Patient/Guardian gives verbal consent for treatment and assignment of benefits for services provided during this visit. Patient/Guardian expressed understanding and agreed to proceed.    Stasia Cavalier, MD 09/28/2023, 10:49 AM   Virtual Visit via Video Note  I connected with Sidonie Coviello on 09/28/23 at 10:30 AM EST by a video enabled telemedicine application and verified that I am speaking with the correct person using two identifiers.  Location: Patient: Home Provider: Home Office   I discussed the limitations of evaluation and management by telemedicine and the availability of in person appointments. The patient expressed understanding and agreed to proceed.   I discussed the assessment and treatment plan with the patient. The patient was provided an opportunity to ask questions and all were answered. The patient agreed with the plan and demonstrated an understanding of the instructions.   The patient was advised to call back or seek an in-person evaluation if the symptoms worsen or if the condition fails to improve as anticipated.  I provided 15 minutes of non-face-to-face time during this encounter.   Stasia Cavalier, MD

## 2023-09-25 ENCOUNTER — Encounter: Payer: Self-pay | Admitting: Nurse Practitioner

## 2023-09-25 ENCOUNTER — Ambulatory Visit: Payer: No Typology Code available for payment source | Admitting: Nurse Practitioner

## 2023-09-25 VITALS — BP 120/67 | HR 111 | Temp 97.3°F | Wt 149.6 lb

## 2023-09-25 DIAGNOSIS — Z113 Encounter for screening for infections with a predominantly sexual mode of transmission: Secondary | ICD-10-CM | POA: Diagnosis not present

## 2023-09-25 NOTE — Progress Notes (Signed)
Subjective   Patient ID: Lindsey [name], female    DOB: 2001/01/11, 22 y.o.   MRN: [id number]  Chief Complaint  Patient presents with   Follow-up    Hospital follow up    Referring provider: Ivonne Andrew, NP  Lindsey [name] is a 22 y.o. female with Past Medical History: No date: Diabetes mellitus without complication [facility])   HPI  Patient presents today for hospital follow-up.  Patient was seen in the emergency room last Wednesday for anaphylactic reaction to nuts.  She was treated and released and has been doing well since that time.  Patient states she will be switching her endocrinologist next week and this will be through [facility]. Patient does need STD screen today. Denies f/c/s, n/v/d, hemoptysis, PND, leg swelling Denies chest pain or edema    Allergies  Allergen Reactions   Justicia Adhatoda (Malabar Nut Tree) [Justicia Adhatoda] Anaphylaxis   Peanut-Containing Drug Products Anaphylaxis   Cephalosporins Other (See Comments) and Nausea And Vomiting   Penicillins Other (See Comments) and Nausea And Vomiting   Naproxen Rash and Other (See Comments)     There is no immunization history on file for this patient.  Tobacco History: Social History   Tobacco Use  Smoking Status Never  Smokeless Tobacco Never   Counseling given: Not Answered   Outpatient Encounter Medications as of 09/25/2023  Medication Sig   Continuous Blood Gluc Sensor (DEXCOM G7 SENSOR) MISC CHANGE EVERY 10 DAYS (90 DAY SUPPLY)   Continuous Glucose Sensor (DEXCOM G7 SENSOR) MISC 1 Device by Does not apply route continuous.   Continuous Glucose Sensor (DEXCOM G7 SENSOR) MISC USE 1 SENSOR CONTINUOUSLY   EPINEPHrine 0.3 mg/0.3 mL IJ SOAJ injection Inject 0.3 mg into the muscle as needed for anaphylaxis.   HUMALOG 100 UNIT/ML injection Use up to 100 units with pump as needed   Insulin Human (INSULIN PUMP) SOLN Inject into the skin 3 times daily with meals, bedtime and 2 AM.   lamoTRIgine (LAMICTAL) 100  MG tablet Take 1 tablet (100 mg total) by mouth daily.   levonorgestrel (KYLEENA) 19.5 MG IUD as directed Intrauterine   Probiotic Product (PROBIOTIC DAILY PO) Take by mouth.   venlafaxine XR (EFFEXOR XR) 75 MG 24 hr capsule Take with 150 mg dose for a total of 225 mg daily   venlafaxine XR (EFFEXOR-XR) 150 MG 24 hr capsule TAKE 1 CAPSULE WITH 75 MG DOSE FOR A TOTAL OF 225 MG DAILY. MUST KEEP APPOINTMENT FOR FUTURE REFILLS   loratadine (CLARITIN) 10 MG tablet Take 10 mg by mouth once. (Patient not taking: Reported on 09/25/2023)   No facility-administered encounter medications on file as of 09/25/2023.    Review of Systems  Review of Systems  Constitutional: Negative.   HENT: Negative.    Cardiovascular: Negative.   Gastrointestinal: Negative.   Allergic/Immunologic: Negative.   Neurological: Negative.   Psychiatric/Behavioral: Negative.       Objective:   BP 120/67   Pulse (!) 111   Temp (!) 97.3 F (36.3 C)   Wt 149 lb 9.6 oz (67.9 kg)   SpO2 100%   BMI 28.27 kg/m   Wt Readings from Last 5 Encounters:  09/25/23 149 lb 9.6 oz (67.9 kg)  03/23/23 146 lb 9.6 oz (66.5 kg)  03/09/23 147 lb (66.7 kg)  06/02/22 142 lb (64.4 kg)  04/06/22 147 lb 9.6 oz (67 kg)     Physical Exam Vitals and nursing note reviewed.  Constitutional:  General: She is not in acute distress.    Appearance: She is well-developed.  Cardiovascular:     Rate and Rhythm: Normal rate and regular rhythm.  Pulmonary:     Effort: Pulmonary effort is normal.     Breath sounds: Normal breath sounds.  Neurological:     Mental Status: She is alert and oriented to person, place, and time.       Assessment & Plan:   Screen for STD (sexually transmitted disease) -     Chlamydia/Gonococcus/Trichomonas, NAA     Return in about 17 days (around 10/12/2023) for if possible for vaccines and follow up.     Ivonne Andrew, NP 09/25/2023

## 2023-09-25 NOTE — Patient Instructions (Addendum)
1. Screen for STD (sexually transmitted disease)  - Chlamydia/Gonococcus/Trichomonas, NAA   Follow up:  Follow up in 3 months

## 2023-09-27 LAB — CHLAMYDIA/GONOCOCCUS/TRICHOMONAS, NAA
Chlamydia by NAA: NEGATIVE
Gonococcus by NAA: NEGATIVE
Trich vag by NAA: NEGATIVE

## 2023-09-28 ENCOUNTER — Encounter (HOSPITAL_COMMUNITY): Payer: Self-pay | Admitting: Psychiatry

## 2023-09-28 ENCOUNTER — Telehealth (HOSPITAL_COMMUNITY): Payer: No Typology Code available for payment source | Admitting: Psychiatry

## 2023-09-28 DIAGNOSIS — F4310 Post-traumatic stress disorder, unspecified: Secondary | ICD-10-CM

## 2023-09-28 DIAGNOSIS — F319 Bipolar disorder, unspecified: Secondary | ICD-10-CM

## 2023-09-28 MED ORDER — VENLAFAXINE HCL ER 150 MG PO CP24: 150.0000 mg | ORAL_CAPSULE | Freq: Every day | ORAL | 1 refills | Status: DC

## 2023-09-28 MED ORDER — VENLAFAXINE HCL ER 75 MG PO CP24
ORAL_CAPSULE | ORAL | 1 refills | Status: DC
Start: 2023-09-28 — End: 2024-04-14

## 2023-09-28 MED ORDER — LAMOTRIGINE 100 MG PO TABS
100.0000 mg | ORAL_TABLET | Freq: Every day | ORAL | 1 refills | Status: DC
Start: 2023-09-28 — End: 2024-04-14

## 2023-10-12 ENCOUNTER — Ambulatory Visit: Payer: No Typology Code available for payment source | Admitting: Nurse Practitioner

## 2023-10-12 ENCOUNTER — Encounter: Payer: Self-pay | Admitting: Nurse Practitioner

## 2023-10-12 VITALS — BP 138/78 | HR 136 | Temp 98.8°F | Wt 150.0 lb

## 2023-10-12 DIAGNOSIS — E559 Vitamin D deficiency, unspecified: Secondary | ICD-10-CM

## 2023-10-12 DIAGNOSIS — Z23 Encounter for immunization: Secondary | ICD-10-CM

## 2023-10-12 DIAGNOSIS — Z13220 Encounter for screening for lipoid disorders: Secondary | ICD-10-CM | POA: Diagnosis not present

## 2023-10-12 DIAGNOSIS — Z1329 Encounter for screening for other suspected endocrine disorder: Secondary | ICD-10-CM | POA: Diagnosis not present

## 2023-10-12 DIAGNOSIS — Z0000 Encounter for general adult medical examination without abnormal findings: Secondary | ICD-10-CM

## 2023-10-12 NOTE — Progress Notes (Signed)
Subjective   Patient ID: Lindsey [name], female    DOB: [birth year], 22 y.o.   MRN: [id number]  Chief Complaint  Patient presents with   Annual Exam    With vaccines     Referring provider: Ivonne Andrew, NP  Lindsey [name] is a 22 y.o. female with Past Medical History: No date: Diabetes mellitus without complication (HCC)   HPI  Patient presents today for physical.  She is requesting Tdap and flu vaccines today.  She has recently been able to get established with endocrinology for type 1 diabetes.  She is also established with psychiatry for mental issues.  Overall she is doing well on current medications. Denies f/c/s, n/v/d, hemoptysis, PND, leg swelling Denies chest pain or edema     Allergies  Allergen Reactions   Justicia Adhatoda (Malabar Nut Tree) [Justicia Adhatoda] Anaphylaxis   Peanut-Containing Drug Products Anaphylaxis   Cephalosporins Other (See Comments) and Nausea And Vomiting   Penicillins Other (See Comments) and Nausea And Vomiting   Naproxen Rash and Other (See Comments)    There is no immunization history for the selected administration types on file for this patient.  Tobacco History: Social History   Tobacco Use  Smoking Status Never  Smokeless Tobacco Never   Counseling given: Not Answered   Outpatient Encounter Medications as of 10/12/2023  Medication Sig   Continuous Blood Gluc Sensor (DEXCOM G7 SENSOR) MISC CHANGE EVERY 10 DAYS (90 DAY SUPPLY)   Continuous Glucose Sensor (DEXCOM G7 SENSOR) MISC 1 Device by Does not apply route continuous.   Continuous Glucose Sensor (DEXCOM G7 SENSOR) MISC USE 1 SENSOR CONTINUOUSLY   EPINEPHrine 0.3 mg/0.3 mL IJ SOAJ injection Inject 0.3 mg into the muscle as needed for anaphylaxis.   HUMALOG 100 UNIT/ML injection Use up to 100 units with pump as needed   insulin glargine (SEMGLEE, YFGN,) 100 UNIT/ML Solostar Pen Inject 35 units nightly if there is a pump failure   Insulin Human (INSULIN PUMP) SOLN Inject  into the skin 3 times daily with meals, bedtime and 2 AM.   lamoTRIgine (LAMICTAL) 100 MG tablet Take 1 tablet (100 mg total) by mouth daily.   levonorgestrel (KYLEENA) 19.5 MG IUD as directed Intrauterine   Probiotic Product (PROBIOTIC DAILY PO) Take by mouth.   venlafaxine XR (EFFEXOR XR) 75 MG 24 hr capsule Take with 150 mg dose for a total of 225 mg daily   venlafaxine XR (EFFEXOR-XR) 150 MG 24 hr capsule Take 1 capsule (150 mg total) by mouth daily. Take with 75 mg tab for a total of 225 mg daily   loratadine (CLARITIN) 10 MG tablet Take 10 mg by mouth once. (Patient not taking: Reported on 10/12/2023)   No facility-administered encounter medications on file as of 10/12/2023.    Review of Systems  Review of Systems  Constitutional: Negative.   HENT: Negative.    Cardiovascular: Negative.   Gastrointestinal: Negative.   Allergic/Immunologic: Negative.   Neurological: Negative.   Psychiatric/Behavioral: Negative.       Objective:   BP 138/78   Pulse (!) 136   Temp 98.8 F (37.1 C)   Wt 150 lb (68 kg)   SpO2 100%   BMI 28.34 kg/m   Wt Readings from Last 5 Encounters:  10/12/23 150 lb (68 kg)  09/25/23 149 lb 9.6 oz (67.9 kg)  03/23/23 146 lb 9.6 oz (66.5 kg)  03/09/23 147 lb (66.7 kg)  06/02/22 142 lb (64.4 kg)     Physical Exam  Vitals and nursing note reviewed.  Constitutional:      General: She is not in acute distress.    Appearance: She is well-developed.  Cardiovascular:     Rate and Rhythm: Normal rate and regular rhythm.  Pulmonary:     Effort: Pulmonary effort is normal.     Breath sounds: Normal breath sounds.  Neurological:     Mental Status: She is alert and oriented to person, place, and time.       Assessment & Plan:   Need for influenza vaccination -     Flu vaccine trivalent PF, 6mos and older(Flulaval,Afluria,Fluarix,Fluzone)  Need for Tdap vaccination -     Td vaccine greater than or equal to 7yo preservative free IM  Thyroid  disorder screen -     TSH  Routine adult health maintenance -     CBC -     Comprehensive metabolic panel  Lipid screening -     Lipid panel  Vitamin D deficiency -     VITAMIN D 25 Hydroxy (Vit-D Deficiency, Fractures)     Return in about 1 year (around 10/11/2024).   Ivonne Andrew, NP 10/12/2023

## 2023-10-12 NOTE — Patient Instructions (Signed)
1. Need for influenza vaccination (Primary)  - Flu vaccine trivalent PF, 6mos and older(Flulaval,Afluria,Fluarix,Fluzone)  2. Need for Tdap vaccination  - Td vaccine greater than or equal to 23yo preservative free IM  3. Thyroid disorder screen  - TSH  4. Routine adult health maintenance  - CBC - Comprehensive metabolic panel  5. Lipid screening  - Lipid Panel  Follow up:  Follow up in 1 year

## 2023-10-13 LAB — CBC
Hematocrit: 44.2 % (ref 34.0–46.6)
Hemoglobin: 14.1 g/dL (ref 11.1–15.9)
MCH: 28.5 pg (ref 26.6–33.0)
MCHC: 31.9 g/dL (ref 31.5–35.7)
MCV: 89 fL (ref 79–97)
Platelets: 351 10*3/uL (ref 150–450)
RBC: 4.95 x10E6/uL (ref 3.77–5.28)
RDW: 11.8 % (ref 11.7–15.4)
WBC: 7.2 10*3/uL (ref 3.4–10.8)

## 2023-10-13 LAB — LIPID PANEL
Chol/HDL Ratio: 2.6 {ratio} (ref 0.0–4.4)
Cholesterol, Total: 181 mg/dL (ref 100–199)
HDL: 70 mg/dL (ref >39)
LDL Chol Calc (NIH): 102 mg/dL — ABNORMAL HIGH (ref 0–99)
Triglycerides: 42 mg/dL (ref 0–149)
VLDL Cholesterol Cal: 9 mg/dL (ref 5–40)

## 2023-10-13 LAB — TSH: TSH: 0.765 u[IU]/mL (ref 0.450–4.500)

## 2023-10-13 LAB — COMPREHENSIVE METABOLIC PANEL
ALT: 13 [IU]/L (ref 0–32)
AST: 11 [IU]/L (ref 0–40)
Albumin: 4.3 g/dL (ref 4.0–5.0)
Alkaline Phosphatase: 90 [IU]/L (ref 44–121)
BUN/Creatinine Ratio: 14 (ref 9–23)
BUN: 11 mg/dL (ref 6–20)
Bilirubin Total: 0.2 mg/dL (ref 0.0–1.2)
CO2: 24 mmol/L (ref 20–29)
Calcium: 9.5 mg/dL (ref 8.7–10.2)
Chloride: 96 mmol/L (ref 96–106)
Creatinine, Ser: 0.77 mg/dL (ref 0.57–1.00)
Globulin, Total: 2.2 g/dL (ref 1.5–4.5)
Glucose: 304 mg/dL — ABNORMAL HIGH (ref 70–99)
Potassium: 4.1 mmol/L (ref 3.5–5.2)
Sodium: 136 mmol/L (ref 134–144)
Total Protein: 6.5 g/dL (ref 6.0–8.5)
eGFR: 111 mL/min/{1.73_m2} (ref >59)

## 2023-10-13 LAB — VITAMIN D 25 HYDROXY (VIT D DEFICIENCY, FRACTURES): Vit D, 25-Hydroxy: 19 ng/mL — ABNORMAL LOW (ref 30.0–100.0)

## 2023-10-18 ENCOUNTER — Other Ambulatory Visit: Payer: Self-pay | Admitting: Nurse Practitioner

## 2023-10-18 MED ORDER — VITAMIN D (ERGOCALCIFEROL) 1.25 MG (50000 UNIT) PO CAPS: 50000.0000 [IU] | ORAL_CAPSULE | ORAL | 2 refills | Status: DC

## 2023-11-27 ENCOUNTER — Telehealth: Payer: Self-pay

## 2023-11-27 NOTE — Telephone Encounter (Signed)
Copied from CRM [id number]. 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 Also, patient has a yeast infection as a result from the chlamyida. Please contact patient at [phone]

## 2023-11-29 NOTE — Telephone Encounter (Unsigned)
Copied from CRM [id number]. Topic: General - Other >> Nov 29, 2023  1:02 PM Gildardo Pounds wrote: Reason for CRM: Patient is returning a call from [employer]. NO answer on CAL. Patient was not on the line when I returned to the call. Called back, no answer, left VM to call back. Patient's callback number is [phone]

## 2023-11-29 NOTE — Telephone Encounter (Addendum)
Attn: Charice Becton  CMA  Patient Lindsey [name] stated that she did not receive a call back from you or Provider Tanda Rockers. Please advise

## 2023-11-30 ENCOUNTER — Ambulatory Visit
Admission: EM | Admit: 2023-11-30 | Discharge: 2023-11-30 | Disposition: A | Discharge status: Home / Self Care | Payer: BC Managed Care – PPO | Attending: Family Medicine | Admitting: Family Medicine

## 2023-11-30 DIAGNOSIS — A749 Chlamydial infection, unspecified: Secondary | ICD-10-CM | POA: Diagnosis present

## 2023-11-30 DIAGNOSIS — Z113 Encounter for screening for infections with a predominantly sexual mode of transmission: Secondary | ICD-10-CM | POA: Insufficient documentation

## 2023-11-30 DIAGNOSIS — Z202 Contact with and (suspected) exposure to infections with a predominantly sexual mode of transmission: Secondary | ICD-10-CM | POA: Diagnosis present

## 2023-11-30 MED ORDER — DOXYCYCLINE HYCLATE 100 MG PO CAPS: 100.0000 mg | ORAL_CAPSULE | Freq: Two times a day (BID) | ORAL | 0 refills | Status: DC

## 2023-11-30 NOTE — ED Triage Notes (Signed)
Pt requested STD's test. States she was exposed to chlamydia.

## 2023-11-30 NOTE — Discharge Instructions (Signed)
Avoid all forms of sexual intercourse (oral, vaginal, anal) for the next 7 days to avoid spreading/reinfecting or at least until we can see what kinds of infection results are positive.  Abstaining for 2 weeks would be better but at least 1 week is required.  We will let you know about your test results from the swab we did today and if you need any prescriptions for antibiotics or changes to your treatment from today.

## 2023-11-30 NOTE — ED Provider Notes (Signed)
[facility]  Note:  This document was prepared using Conservation officer, historic buildings and may include unintentional dictation errors.  MRN: [id number] DOB: 05/07/2001  Subjective:   Lindsey [name] is a 23 y.o. female presenting for STI exposure.  Patient had sex with somebody that tested positive for chlamydia.  Would like STI testing.  Denies fever, n/v, abdominal pain, pelvic pain, rashes, dysuria, urinary frequency, hematuria, vaginal discharge.  Has an IUD.  No concern for pregnancy.  No current facility-administered medications for this encounter.  Current Outpatient Medications:    Continuous Blood Gluc Sensor (DEXCOM G7 SENSOR) MISC, CHANGE EVERY 10 DAYS (90 DAY SUPPLY), Disp: , Rfl:    Continuous Glucose Sensor (DEXCOM G7 SENSOR) MISC, 1 Device by Does not apply route continuous., Disp: 9 each, Rfl: 0   Continuous Glucose Sensor (DEXCOM G7 SENSOR) MISC, USE 1 SENSOR CONTINUOUSLY, Disp: 9 each, Rfl: 3   EPINEPHrine 0.3 mg/0.3 mL IJ SOAJ injection, Inject 0.3 mg into the muscle as needed for anaphylaxis., Disp: 1 each, Rfl: 0   HUMALOG 100 UNIT/ML injection, Use up to 100 units with pump as needed, Disp: 30 mL, Rfl: 5   insulin glargine (SEMGLEE, YFGN,) 100 UNIT/ML Solostar Pen, Inject 35 units nightly if there is a pump failure, Disp: , Rfl:    Insulin Human (INSULIN PUMP) SOLN, Inject into the skin 3 times daily with meals, bedtime and 2 AM., Disp: , Rfl:    lamoTRIgine (LAMICTAL) 100 MG tablet, Take 1 tablet (100 mg total) by mouth daily., Disp: 90 tablet, Rfl: 1   levonorgestrel (KYLEENA) 19.5 MG IUD, as directed Intrauterine, Disp: , Rfl:    loratadine (CLARITIN) 10 MG tablet, Take 10 mg by mouth once. (Patient not taking: Reported on 09/25/2023), Disp: , Rfl:    Probiotic Product (PROBIOTIC DAILY PO), Take by mouth., Disp: , Rfl:    venlafaxine XR (EFFEXOR XR) 75 MG 24 hr capsule, Take with 150 mg dose for a total of 225 mg daily, Disp: 90 capsule, Rfl: 1    venlafaxine XR (EFFEXOR-XR) 150 MG 24 hr capsule, Take 1 capsule (150 mg total) by mouth daily. Take with 75 mg tab for a total of 225 mg daily, Disp: 90 capsule, Rfl: 1   Vitamin D, Ergocalciferol, (DRISDOL) 1.25 MG (50000 UNIT) CAPS capsule, Take 1 capsule (50,000 Units total) by mouth every 7 (seven) days., Disp: 5 capsule, Rfl: 2   Allergies  Allergen Reactions   Cashew Nut Oil Anaphylaxis   Corylus Anaphylaxis   Justicia Adhatoda (Malabar Nut Tree) [Justicia Adhatoda] Anaphylaxis   Peanut-Containing Drug Products Anaphylaxis   Tree Extract Anaphylaxis   Cephalosporins Nausea And Vomiting and Other (See Comments)   Penicillins Nausea And Vomiting and Other (See Comments)    When she was a child   Naproxen Other (See Comments), Rash and Hives    Past Medical History:  Diagnosis Date   Diabetes mellitus without complication (HCC)      History reviewed. No pertinent surgical history.  Family History  Problem Relation Age of Onset   Depression Mother    Cancer Maternal Aunt    Stroke Paternal Aunt    Heart disease Maternal Grandmother    Diabetes Maternal Grandfather    Heart disease Paternal Grandmother    Stroke Paternal Grandfather     Social History   Tobacco Use   Smoking status: Never   Smokeless tobacco: Never  Vaping Use   Vaping status: Some Days   Substances:  Nicotine, Flavoring  Substance Use Topics   Alcohol use: Yes    Comment: occ   Drug use: Yes    Types: Marijuana    ROS   Objective:   Vitals: BP 108/63 (BP Location: Left Arm)   Pulse 97   Temp 99.3 F (37.4 C) (Oral)   Resp 16   SpO2 99%   Physical Exam Constitutional:      General: She is not in acute distress.    Appearance: Normal appearance. She is well-developed. She is not ill-appearing, toxic-appearing or diaphoretic.  HENT:     Head: Normocephalic and atraumatic.     Right Ear: External ear normal.     Left Ear: External ear normal.     Nose: Nose normal.      Mouth/Throat:     Mouth: Mucous membranes are moist.  Eyes:     General: No scleral icterus.       Right eye: No discharge.        Left eye: No discharge.     Extraocular Movements: Extraocular movements intact.  Cardiovascular:     Rate and Rhythm: Normal rate.  Pulmonary:     Effort: Pulmonary effort is normal.  Skin:    General: Skin is warm and dry.  Neurological:     General: No focal deficit present.     Mental Status: She is alert and oriented to person, place, and time.  Psychiatric:        Mood and Affect: Mood normal.        Behavior: Behavior normal.     Assessment and Plan :   PDMP not reviewed this encounter.  1. Chlamydia infection   2. Exposure to chlamydia    Patient treated empirically as per CDC guidelines with doxycycline as an outpatient.  Labs pending.   Counseled on safe sex practices including abstaining for 1 week following treatment.  Counseled patient on potential for adverse effects with medications prescribed/recommended today, ER and return-to-clinic precautions discussed, patient verbalized understanding.    Wallis Bamberg, [place] 11/30/23 1610

## 2023-12-01 LAB — RPR: RPR Ser Ql: NONREACTIVE

## 2023-12-01 LAB — HIV ANTIBODY (ROUTINE TESTING W REFLEX): HIV Screen 4th Generation wRfx: NONREACTIVE

## 2023-12-03 LAB — CERVICOVAGINAL ANCILLARY ONLY
Chlamydia: POSITIVE — AB
Comment: NEGATIVE
Comment: NEGATIVE
Comment: NORMAL
Neisseria Gonorrhea: NEGATIVE
Trichomonas: NEGATIVE

## 2024-03-28 ENCOUNTER — Telehealth (HOSPITAL_COMMUNITY): Payer: No Typology Code available for payment source | Admitting: Psychiatry

## 2024-04-12 ENCOUNTER — Other Ambulatory Visit (HOSPITAL_COMMUNITY): Payer: Self-pay | Admitting: Psychiatry

## 2024-04-12 DIAGNOSIS — F4310 Post-traumatic stress disorder, unspecified: Secondary | ICD-10-CM

## 2024-04-12 DIAGNOSIS — F319 Bipolar disorder, unspecified: Secondary | ICD-10-CM

## 2024-04-14 NOTE — Progress Notes (Unsigned)
 [facility] MD/PA/NP OP Progress Note  04/17/2024 4:16 PM Lindsey [name]  MRN:  [id number]  Visit Diagnosis:    ICD-10-CM   1. Bipolar 1 disorder (HCC)  F31.9 lamoTRIgine  (LAMICTAL ) 100 MG tablet    2. PTSD (post-traumatic stress disorder)  F43.10 venlafaxine  XR (EFFEXOR -XR) 150 MG 24 hr capsule    venlafaxine  XR (EFFEXOR -XR) 75 MG 24 hr capsule      Assessment: Lindsey [name] is a 23 y.o. female with a history of Bipolar disorder, PTSD, and GAD who presented to [facility] for initial evaluation on 06/12/22.  At initial evaluation patient reported being diagnosed with GAD, PTSD, and bipolar disorder as a teenager.  She endorsed a history of symptoms of manic-like behaviors including promiscuity, impulsiveness, auditory hallucinations (primarily during periods of increased stress), decreased need for sleep for about a week at a time, and irritability.  Patient denied substance use contributing to this manic episode and reports the last manic episode was 5 years ago.  Patient has had depressive episodes more recently where she experiences decreased energy, fatigue, anhedonia, amotivation, and hypersomnia.  These episodes can progressed to the point of passive and active SI and patient tried to commit suicide via overdose in 2020.  She was hospitalized at that time and her medication was titrated.  Patient also endorsed symptoms of PTSD secondary to past trauma with symptoms of nightmares, flashbacks, and dissociations which have decreased in frequency over the last few years.  Patient meets criteria for a diagnosis of PTSD and GAD.  While the diagnosis of bipolar disorder is a little bit less certain patient does note her mother also has a history of manic episodes and hallucinations.  We discussed the risk of antidepressant medications on triggering a manic state which patient is aware and reports that she has not had a manic state since starting the venlafaxine  5 years ago.    Lindsey [name]  presents for follow-up evaluation. Today, 04/17/24, patient reports that her moods have been stable in the interim.  Glucose levels have been better controlled compared to last visit.  She is continuing with therapy and has been going well.  Patient is taking her medication consistently denying any adverse side effects.  We will continue on her current regimen and follow up in 6 months.  Plan: - Continue Lamictal  100 mg QD - Continue venlafaxine  150 capsule and 75 capsule for 225 mg QD - CMP, CBC, lipid profile, Vit D and A1c reviewed - Continue with therapist every 3 weeks - Follow up in 6 months  Chief Complaint:  Chief Complaint  Patient presents with   Follow-up   HPI: Lindsey [name] presents reporting that she is doing well. Lindsey [name] has been keeping busy with work, school, and volunteering.  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  She has also done well at school and plans to transfer to [employer] next semester.  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  Patient has been working.  With this increased schedule and constant busyness patient almost feels as if she is manic.  That said she does not have any other associated symptoms such as lack of sleep, impulsive or reckless behaviors, disorganization, racing thoughts, or pressured speech.  She has continued to meet with the therapist and working on accepting that it is okay for her to feel good and productive and  not be overly fearful of mania.  She continues to take her medications consistently denying any adverse side effects.  Past Psychiatric History: Patient reports a past psychiatric history of bipolar disorder, general anxiety, and PTSD. She notes that she has had suicidal thoughts in the past with one prior attempt in 2020 by overdose and been hospitalized in 2015, 2017, and 2020. Patient has been on  a psychiatric regimen of Lamictal  100  gm QD and venlafaxine  225 mg QD with good management of her symptoms.  She reports that she was also on Abilify in the past which was stopped due to over sedation and negative effects on her diabetes.  Past Medical History:  Past Medical History:  Diagnosis Date   Diabetes mellitus without complication (HCC)    History reviewed. No pertinent surgical history.  Family Psychiatric History: Patient reports a past psychiatric history of opiate use disorder in her father and subsequent overdose on fentanyl.  She also endorsed anxiety and depressive issues on her mother's side with her mother experiencing bipolar/manic symptoms in her 60s as well.  Family History:  Family History  Problem Relation Age of Onset   Depression Mother    Cancer Maternal Aunt    Stroke Paternal Aunt    Heart disease Maternal Grandmother    Diabetes Maternal Grandfather    Heart disease Paternal Grandmother    Stroke Paternal Grandfather     Social History:  Social History   Socioeconomic History   Marital status: Single    Spouse name: Not on file   Number of children: Not on file   Years of education: Not on file   Highest education level: Not on file  Occupational History   Not on file  Tobacco Use   Smoking status: Never   Smokeless tobacco: Never  Vaping Use   Vaping status: Some Days   Substances: Nicotine, Flavoring  Substance and Sexual Activity   Alcohol use: Yes    Comment: occ   Drug use: Yes    Types: Marijuana   Sexual activity: Yes    Birth control/protection: None  Other Topics Concern   Not on file  Social History Narrative   Not on file   Social Drivers of Health   Financial Resource Strain: Not on file  Food Insecurity: Low Risk  (10/03/2023)   Received from [facility]   Hunger Vital Sign    Within the past 12 months, you worried that your food would run out before you got money to buy more: Never true    Within the past 12  months, the food you bought just didn't last and you didn't have money to get more. : Never true  Transportation Needs: No Transportation Needs (10/03/2023)   Received from [facility]    In the past 12 months, has lack of reliable transportation kept you from medical appointments, meetings, work or from getting things needed for daily living? : No  Physical Activity: Not on file  Stress: Not on file  Social Connections: Not on file    Allergies:  Allergies  Allergen Reactions   Cashew Nut Oil Anaphylaxis   Corylus Anaphylaxis   Justicia Adhatoda (Malabar Nut Tree) [Justicia Adhatoda] Anaphylaxis   Peanut-Containing Drug Products Anaphylaxis   Tree Extract Anaphylaxis   Cephalosporins Nausea And Vomiting and Other (See Comments)   Penicillins Nausea And Vomiting and Other (See Comments)    When she was a child   Naproxen Other (See Comments), Rash  and Hives    Current Medications: Current Outpatient Medications  Medication Sig Dispense Refill   Continuous Blood Gluc Sensor (DEXCOM G7 SENSOR) MISC CHANGE EVERY 10 DAYS (90 DAY SUPPLY)     Continuous Glucose Sensor (DEXCOM G7 SENSOR) MISC 1 Device by Does not apply route continuous. 9 each 0   Continuous Glucose Sensor (DEXCOM G7 SENSOR) MISC USE 1 SENSOR CONTINUOUSLY 9 each 3   doxycycline  (VIBRAMYCIN ) 100 MG capsule Take 1 capsule (100 mg total) by mouth 2 (two) times daily. 14 capsule 0   EPINEPHrine  0.3 mg/0.3 mL IJ SOAJ injection Inject 0.3 mg into the muscle as needed for anaphylaxis. 1 each 0   HUMALOG  100 UNIT/ML injection Use up to 100 units with pump as needed 30 mL 5   insulin  glargine (SEMGLEE, YFGN,) 100 UNIT/ML Solostar Pen Inject 35 units nightly if there is a pump failure     Insulin  Human (INSULIN  PUMP) SOLN Inject into the skin 3 times daily with meals, bedtime and 2 AM.     lamoTRIgine  (LAMICTAL ) 100 MG tablet Take 1 tablet (100 mg total) by mouth daily. 90 tablet 1   levonorgestrel (KYLEENA)  19.5 MG IUD as directed Intrauterine     loratadine (CLARITIN) 10 MG tablet Take 10 mg by mouth once. (Patient not taking: Reported on 09/25/2023)     Probiotic Product (PROBIOTIC DAILY PO) Take by mouth.     venlafaxine  XR (EFFEXOR -XR) 150 MG 24 hr capsule Take 1 capsule (150 mg total) by mouth daily. Take with 75 mg tab for a total of 225 mg daily 90 capsule 1   venlafaxine  XR (EFFEXOR -XR) 75 MG 24 hr capsule TAKE 1 CAPSULE WITH 150 MG DOSE FOR TOTAL OF 225 MG DAILY (PATIENT MUST KEEP APPOINTMENT FOR FUTURE REFILLS) 90 capsule 1   Vitamin D , Ergocalciferol , (DRISDOL ) 1.25 MG (50000 UNIT) CAPS capsule Take 1 capsule (50,000 Units total) by mouth every 7 (seven) days. 5 capsule 2   No current facility-administered medications for this visit.     Psychiatric Specialty Exam: Review of Systems  There were no vitals taken for this visit.There is no height or weight on file to calculate BMI.  General Appearance: Well Groomed  Eye Contact:  Good  Speech:  Clear and Coherent  Volume:  Normal  Mood:  Euthymic  Affect:  Congruent  Thought Process:  Coherent  Orientation:  Full (Time, Place, and Person)  Thought Content: Logical   Suicidal Thoughts:  No  Homicidal Thoughts:  No  Memory:  Immediate;   Good  Judgement:  Good  Insight:  Good  Psychomotor Activity:  Normal  Concentration:  Concentration: Good  Recall:  Good  Fund of Knowledge: Good  Language: Good  Akathisia:  NA    AIMS (if indicated): not done  Assets:  Architect Physical Health Resilience  ADL's:  Intact  Cognition: WNL  Sleep:  Good   Metabolic Disorder Labs: Lab Results  Component Value Date   HGBA1C 9.9 (H) 03/09/2023   No results found for: PROLACTIN Lab Results  Component Value Date   CHOL 181 10/12/2023   TRIG 42 10/12/2023   HDL 70 10/12/2023   CHOLHDL 2.6 10/12/2023   VLDL 8.6 03/09/2023   LDLCALC 102 (H) 10/12/2023   LDLCALC 82 03/09/2023   Lab  Results  Component Value Date   TSH 0.765 10/12/2023    Therapeutic Level Labs: No results found for: LITHIUM No results found for: VALPROATE No results found for: CBMZ  Screenings: PHQ2-9    Flowsheet Row [facility]  PHQ-2 Total Score 1   Flowsheet Row UC from 11/30/2023 in [facility] [employer] [facility] Office Visit from 06/13/2022 [facility] from [facility]/02/2022 in [facility] Department at [facility]  C-SSRS RISK CATEGORY No Risk No Risk No Risk    Collaboration of Care: Collaboration of Care: Medication Management AEB medication prescription and Other provider involved in patient's care AEB Endocrinology chart review  Patient/Guardian was advised Release of Information must be obtained prior to any record release in order to collaborate their care with an outside provider. Patient/Guardian was advised if they have not already done so to contact the registration department to sign all necessary forms in order for us  to release information regarding their care.   Consent: Patient/Guardian gives verbal consent for treatment and assignment of benefits for services provided during this visit. Patient/Guardian expressed understanding and agreed to proceed.    Arvella CHRISTELLA Finder, MD 04/17/2024, 4:16 PM   Virtual Visit via Video Note  I connected with Paulene Munsch on 04/17/24 at  4:00 PM EDT by a video enabled telemedicine application and verified that I am speaking with the correct person using two identifiers.  Location: Patient: Home Provider: Home Office   I discussed the limitations of evaluation and management by telemedicine and the availability of in person appointments. The patient expressed understanding and agreed to proceed.   I discussed the assessment and treatment plan with the patient. The patient was provided  an opportunity to ask questions and all were answered. The patient agreed with the plan and demonstrated an understanding of the instructions.   The patient was advised to call back or seek an in-person evaluation if the symptoms worsen or if the condition fails to improve as anticipated.  I provided 15 minutes of non-face-to-face time during this encounter.   Arvella CHRISTELLA Finder, MD

## 2024-04-17 ENCOUNTER — Telehealth (HOSPITAL_BASED_OUTPATIENT_CLINIC_OR_DEPARTMENT_OTHER): Payer: Self-pay | Admitting: Psychiatry

## 2024-04-17 ENCOUNTER — Encounter (HOSPITAL_COMMUNITY): Payer: Self-pay | Admitting: Psychiatry

## 2024-04-17 DIAGNOSIS — F319 Bipolar disorder, unspecified: Secondary | ICD-10-CM | POA: Diagnosis not present

## 2024-04-17 DIAGNOSIS — F4310 Post-traumatic stress disorder, unspecified: Secondary | ICD-10-CM | POA: Diagnosis not present

## 2024-04-17 MED ORDER — LAMOTRIGINE 100 MG PO TABS: 100.0000 mg | ORAL_TABLET | Freq: Every day | ORAL | 1 refills | Status: DC

## 2024-04-17 MED ORDER — VENLAFAXINE HCL ER 150 MG PO CP24: 150.0000 mg | ORAL_CAPSULE | Freq: Every day | ORAL | 1 refills | Status: DC

## 2024-04-17 MED ORDER — VENLAFAXINE HCL ER 75 MG PO CP24: ORAL_CAPSULE | ORAL | 1 refills | Status: DC

## 2024-07-14 ENCOUNTER — Ambulatory Visit (INDEPENDENT_AMBULATORY_CARE_PROVIDER_SITE_OTHER): Admitting: Nurse Practitioner

## 2024-07-14 ENCOUNTER — Encounter: Payer: Self-pay | Admitting: Nurse Practitioner

## 2024-07-14 VITALS — BP 126/68 | HR 111 | Temp 99.0°F | Ht 61.0 in | Wt 135.0 lb

## 2024-07-14 DIAGNOSIS — N898 Other specified noninflammatory disorders of vagina: Secondary | ICD-10-CM

## 2024-07-14 LAB — POCT URINALYSIS DIP (CLINITEK)
Bilirubin, UA: NEGATIVE
Blood, UA: NEGATIVE
Glucose, UA: NEGATIVE mg/dL
Leukocytes, UA: NEGATIVE
Nitrite, UA: NEGATIVE
POC PROTEIN,UA: NEGATIVE
Spec Grav, UA: 1.02 (ref 1.010–1.025)
Urobilinogen, UA: 0.2 U/dL
pH, UA: 7 (ref 5.0–8.0)

## 2024-07-14 MED ORDER — FLUCONAZOLE 150 MG PO TABS: 150.0000 mg | ORAL_TABLET | Freq: Every day | ORAL | 0 refills | Status: DC

## 2024-07-14 NOTE — Progress Notes (Signed)
 Subjective   Patient ID: Lindsey [name], female    DOB: 05/13/2001, 23 y.o.   MRN: [id number]  Chief Complaint  Patient presents with   Vaginitis    Poss yeast infection - chunky d/c. Itching, irritation X2 weeks ago Previous Diflucan  rx from UC did not resolve     Referring provider: Oley Bascom RAMAN, NP  Lindsey [name] is a 23 y.o. female with Past Medical History: No date: Diabetes mellitus without complication (HCC)   HPI  Patient presents today for an acute visit.  She states that she continues to have vaginal irritation with chunky white discharge.  This has been an issue for around 3 weeks now.  She was seen in urgent care a few weeks ago was prescribed Diflucan .  She states that this did help but the yeast infection did come back.  We will check NuSwab and UA today.  UA is clear in office today.  We will order Diflucan  for 3 doses.  We will place a referral to OB/GYN to follow-up. Denies f/c/s, n/v/d, hemoptysis, PND, leg swelling Denies chest pain or edema     Allergies  Allergen Reactions   Cashew Nut Oil Anaphylaxis   Corylus Anaphylaxis   Justicia Adhatoda (Malabar Nut Tree) [Justicia Adhatoda] Anaphylaxis   Peanut-Containing Drug Products Anaphylaxis   Tree Extract Anaphylaxis   Cephalosporins Nausea And Vomiting and Other (See Comments)   Penicillins Nausea And Vomiting and Other (See Comments)    When she was a child   Naproxen Other (See Comments), Rash and Hives    Immunization History  Administered Date(s) Administered   Influenza, Seasonal, Injecte, Preservative Fre 10/12/2023   Td 10/12/2023    Tobacco History: Social History   Tobacco Use  Smoking Status Never  Smokeless Tobacco Never   Counseling given: Not Answered   Outpatient Encounter Medications as of 07/14/2024  Medication Sig   Continuous Glucose Sensor (DEXCOM G7 SENSOR) MISC USE 1 SENSOR CONTINUOUSLY   EPINEPHrine  0.3 mg/0.3 mL IJ SOAJ injection Inject 0.3 mg into the muscle as needed  for anaphylaxis.   fluconazole  (DIFLUCAN ) 150 MG tablet Take 1 tablet (150 mg total) by mouth daily. May take 3 doses on day 1,3 &7.   HUMALOG  100 UNIT/ML injection Use up to 100 units with pump as needed   insulin  glargine (SEMGLEE, YFGN,) 100 UNIT/ML Solostar Pen Inject 35 units nightly if there is a pump failure   Insulin  Human (INSULIN  PUMP) SOLN Inject into the skin 3 times daily with meals, bedtime and 2 AM.   lamoTRIgine  (LAMICTAL ) 100 MG tablet Take 1 tablet (100 mg total) by mouth daily.   levonorgestrel (KYLEENA) 19.5 MG IUD as directed Intrauterine   loratadine (CLARITIN) 10 MG tablet Take 10 mg by mouth once.   venlafaxine  XR (EFFEXOR -XR) 150 MG 24 hr capsule Take 1 capsule (150 mg total) by mouth daily. Take with 75 mg tab for a total of 225 mg daily   venlafaxine  XR (EFFEXOR -XR) 75 MG 24 hr capsule TAKE 1 CAPSULE WITH 150 MG DOSE FOR TOTAL OF 225 MG DAILY (PATIENT MUST KEEP APPOINTMENT FOR FUTURE REFILLS)   Continuous Blood Gluc Sensor (DEXCOM G7 SENSOR) MISC CHANGE EVERY 10 DAYS (90 DAY SUPPLY)   Continuous Glucose Sensor (DEXCOM G7 SENSOR) MISC 1 Device by Does not apply route continuous.   doxycycline  (VIBRAMYCIN ) 100 MG capsule Take 1 capsule (100 mg total) by mouth 2 (two) times daily.   Probiotic Product (PROBIOTIC DAILY PO) Take by mouth.  Vitamin D , Ergocalciferol , (DRISDOL ) 1.25 MG (50000 UNIT) CAPS capsule Take 1 capsule (50,000 Units total) by mouth every 7 (seven) days.   No facility-administered encounter medications on file as of 07/14/2024.    Review of Systems  Review of Systems  Constitutional: Negative.   HENT: Negative.    Cardiovascular: Negative.   Gastrointestinal: Negative.   Genitourinary:  Positive for vaginal discharge.  Allergic/Immunologic: Negative.   Neurological: Negative.   Psychiatric/Behavioral: Negative.       Objective:   BP 126/68 (BP Location: Left Arm, Patient Position: Sitting, Cuff Size: Normal)   Pulse (!) 111   Temp 99 F  (37.2 C) (Oral)   Ht 5' 1 (1.549 m)   Wt 135 lb (61.2 kg)   SpO2 100%   BMI 25.51 kg/m   Wt Readings from Last 5 Encounters:  07/14/24 135 lb (61.2 kg)  10/12/23 150 lb (68 kg)  09/25/23 149 lb 9.6 oz (67.9 kg)  03/23/23 146 lb 9.6 oz (66.5 kg)  03/09/23 147 lb (66.7 kg)     Physical Exam Vitals and nursing note reviewed.  Constitutional:      General: She is not in acute distress.    Appearance: She is well-developed.  Cardiovascular:     Rate and Rhythm: Normal rate and regular rhythm.  Pulmonary:     Effort: Pulmonary effort is normal.     Breath sounds: Normal breath sounds.  Neurological:     Mental Status: She is alert and oriented to person, place, and time.       Assessment & Plan:   Vaginal irritation -     NuSwab Vaginitis Plus (VG+) -     POCT URINALYSIS DIP (CLINITEK) -     Fluconazole ; Take 1 tablet (150 mg total) by mouth daily. May take 3 doses on day 1,3 &7.  Dispense: 3 tablet; Refill: 0 -     Ambulatory referral to Obstetrics / Gynecology     Return in about 3 months (around 10/13/2024).   Bascom GORMAN Borer, NP 07/14/2024

## 2024-07-17 LAB — NUSWAB VAGINITIS PLUS (VG+)
Atopobium vaginae: HIGH {score} — AB
Candida albicans, NAA: POSITIVE — AB
Candida glabrata, NAA: NEGATIVE
Chlamydia trachomatis, NAA: NEGATIVE
Neisseria gonorrhoeae, NAA: NEGATIVE
Trich vag by NAA: NEGATIVE

## 2024-07-18 ENCOUNTER — Ambulatory Visit: Payer: Self-pay | Admitting: Nurse Practitioner

## 2024-07-18 MED ORDER — METRONIDAZOLE 500 MG PO TABS: 500.0000 mg | ORAL_TABLET | Freq: Two times a day (BID) | ORAL | 0 refills | Status: AC

## 2024-07-19 ENCOUNTER — Encounter (HOSPITAL_COMMUNITY): Payer: Self-pay

## 2024-07-23 ENCOUNTER — Ambulatory Visit: Admitting: Nurse Practitioner

## 2024-07-29 ENCOUNTER — Other Ambulatory Visit (HOSPITAL_COMMUNITY): Payer: Self-pay

## 2024-07-29 DIAGNOSIS — F319 Bipolar disorder, unspecified: Secondary | ICD-10-CM

## 2024-08-04 ENCOUNTER — Other Ambulatory Visit (HOSPITAL_COMMUNITY): Payer: Self-pay

## 2024-08-06 ENCOUNTER — Other Ambulatory Visit: Payer: Self-pay | Admitting: Nurse Practitioner

## 2024-08-06 DIAGNOSIS — N898 Other specified noninflammatory disorders of vagina: Secondary | ICD-10-CM

## 2024-08-06 MED ORDER — FLUCONAZOLE 150 MG PO TABS: 150.0000 mg | ORAL_TABLET | Freq: Every day | ORAL | 0 refills | Status: AC

## 2024-10-05 ENCOUNTER — Other Ambulatory Visit (HOSPITAL_COMMUNITY): Payer: Self-pay | Admitting: Psychiatry

## 2024-10-05 DIAGNOSIS — F319 Bipolar disorder, unspecified: Secondary | ICD-10-CM

## 2024-10-14 ENCOUNTER — Telehealth (HOSPITAL_COMMUNITY): Admitting: Psychiatry

## 2024-10-21 ENCOUNTER — Telehealth (HOSPITAL_COMMUNITY): Admitting: Psychiatry

## 2024-11-10 NOTE — Progress Notes (Unsigned)
 BH MD/PA/NP OP Progress Note  11/13/2024 2:31 PM Lindsey [name]  MRN:  [id number]  Visit Diagnosis:    ICD-10-CM   1. PTSD (post-traumatic stress disorder)  F43.10 venlafaxine  XR (EFFEXOR -XR) 75 MG 24 hr capsule    venlafaxine  XR (EFFEXOR -XR) 150 MG 24 hr capsule    2. Bipolar 1 disorder (HCC)  F31.9 lamoTRIgine  (LAMICTAL ) 100 MG tablet      Assessment: Lindsey [name] is a 24 y.o. female with a history of Bipolar disorder, PTSD, and GAD who presented to [facility] for initial evaluation on 06/12/22.  At initial evaluation patient reported being diagnosed with GAD, PTSD, and bipolar disorder as a teenager.  She endorsed a history of symptoms of manic-like behaviors including promiscuity, impulsiveness, auditory hallucinations (primarily during periods of increased stress), decreased need for sleep for about a week at a time, and irritability.  Patient denied substance use contributing to this manic episode and reports the last manic episode was 5 years ago.  Patient has had depressive episodes more recently where she experiences decreased energy, fatigue, anhedonia, amotivation, and hypersomnia.  These episodes can progressed to the point of passive and active SI and patient tried to commit suicide via overdose in 2020.  She was hospitalized at that time and her medication was titrated.  Patient also endorsed symptoms of PTSD secondary to past trauma with symptoms of nightmares, flashbacks, and dissociations which have decreased in frequency over the last few years.  Patient meets criteria for a diagnosis of PTSD and GAD.  While the diagnosis of bipolar disorder is a little bit less certain patient does note her mother also has a history of manic episodes and hallucinations.  We discussed the risk of antidepressant medications on triggering a manic state which patient is aware and reports that she has not had a manic state since starting the venlafaxine  5 years ago.    Lindsey [name]  presents for follow-up evaluation. Today, 11/13/24, patient has had stable moods despite several ongoing psychosocial stressors.  She is taking medication consistently and denies adverse side effects.  Will continue on her current regimen and follow-up in 6 months.  Plan: - Continue Lamictal  100 mg QD - Continue venlafaxine  150 capsule and 75 capsule for 225 mg QD - CMP, CBC, lipid profile, Vit D and A1c reviewed - Continue with therapist every 3 weeks - Follow up in 6 months  Chief Complaint:  Chief Complaint  Patient presents with   Follow-up   HPI: Lindsey [name] presents reporting that she is alright. Things have been busy the last 6 months.  She had moved out of her former accommodations and into a house with one of her best friends of the last 10 years.  The goal was that her the friend and the partner would all find a house together.  Things however did not go well and tensions built between them which eventually led to Lindsey [name] moving out to stay in a hotel.  She was unable to stay with her friend for a few weeks before getting a place of her own.  Things are going well in the new apartment.  The fallout and the relationship with her long-term friend has been difficult and things were only started to rekindle now.  That said she does not plan to address it fully until things are a bit more stable in her life.  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  This new job will be a bit more lucrative and she is hoping that will allow her to work a little bit less and focus more on school setting as a schedule pick up once the vet tech program starts.  Discussed this and encourage patient to focus on her mental health during this change.  In particular if she does work overnight shifts make sure that any sleep dysregulation is not pushing her  towards hypomania/mania.    She is taking medication consistently and denies adverse side effects.  She thinks it still working well.  Every time she goes through major life event she expects to spiral but has not.  Patient is thankful that this has been the case.  She does mention that she had her IUD removed due to some gynecological issues.  Patient denies any significant change with this other than some increased irritability during her menstrual cycle.  It is not overly impactful or having a negative effect on her work.  Past Psychiatric History: Patient reports a past psychiatric history of bipolar disorder, general anxiety, and PTSD. She notes that she has had suicidal thoughts in the past with one prior attempt in 2020 by overdose and been hospitalized in 2015, 2017, and 2020. Patient has been on a psychiatric regimen of Lamictal  100  gm QD and venlafaxine  225 mg QD with good management of her symptoms.  She reports that she was also on Abilify in the past which was stopped due to over sedation and negative effects on her diabetes.  Past Medical History:  Past Medical History:  Diagnosis Date   Diabetes mellitus without complication (HCC)    History reviewed. No pertinent surgical history.  Family Psychiatric History: Patient reports a past psychiatric history of opiate use disorder in her father and subsequent overdose on fentanyl.  She also endorsed anxiety and depressive issues on her mother's side with her mother experiencing bipolar/manic symptoms in her 18s as well.  Family History:  Family History  Problem Relation Age of Onset   Depression Mother    Cancer Maternal Aunt    Stroke Paternal Aunt    Heart disease Maternal Grandmother    Diabetes Maternal Grandfather    Heart disease Paternal Grandmother    Stroke Paternal Grandfather     Social History:  Social History   Socioeconomic History   Marital status: Single    Spouse name: Not on file   Number of children: Not  on file   Years of education: Not on file   Highest education level: Not on file  Occupational History   Not on file  Tobacco Use   Smoking status: Never   Smokeless tobacco: Never  Vaping Use   Vaping status: Some Days   Substances: Nicotine, Flavoring  Substance and Sexual Activity   Alcohol use: Yes    Comment: occ   Drug use: Yes    Types: Marijuana   Sexual activity: Yes    Birth control/protection: None  Other Topics Concern   Not on file  Social History Narrative   Not on file   Social Drivers of Health   Tobacco Use: Low Risk (11/13/2024)   Patient History    Smoking Tobacco Use: Never    Smokeless Tobacco Use: Never    Passive Exposure: Not on file  Recent Concern: Tobacco Use - High Risk (10/28/2024)   Received from [facility]   Patient History    Smoking Tobacco Use: Light Smoker    Smokeless Tobacco Use: Never    Passive Exposure: Not on file  Financial Resource Strain: Patient Declined (07/14/2024)   Overall Financial Resource Strain (CARDIA)    Difficulty of Paying Living Expenses: Patient declined  Food Insecurity: Patient Declined (07/14/2024)   Epic    Worried About Programme Researcher, Broadcasting/film/video in the Last Year: Patient declined    Barista in the Last Year: Patient declined  Transportation Needs: Patient Declined (07/14/2024)   Epic    Lack of Transportation (Medical): Patient declined    Lack of Transportation (Non-Medical): Patient declined  Physical Activity: Insufficiently Active (07/14/2024)   Exercise Vital Sign    Days of Exercise per Week: 3 days    Minutes of Exercise per Session: 30 min  Stress: Stress Concern Present (07/14/2024)   [employer] of Occupational Health - Occupational Stress Questionnaire    Feeling of Stress: To some extent  Social Connections: Unknown (07/14/2024)   Social Connection and Isolation Panel    Frequency of Communication with Friends and Family: Patient declined    Frequency of Social Gatherings with  Friends and Family: Patient declined    Attends Religious Services: Patient declined    Active Member of Clubs or Organizations: Patient declined    Attends Banker Meetings: Not on file    Marital Status: Patient declined  Depression (PHQ2-9): Low Risk (06/13/2022)   Depression (PHQ2-9)    PHQ-2 Score: 1  Alcohol Screen: Low Risk (07/14/2024)   Alcohol Screen    Last Alcohol Screening Score (AUDIT): 0  Housing: Unknown (07/14/2024)   Epic    Unable to Pay for Housing in the Last Year: Patient declined    Number of Times Moved in the Last Year: 1    Homeless in the Last Year: Patient declined  Utilities: Low Risk (10/03/2023)   Received from [facility]   Utilities    In the past 12 months has the electric, gas, oil, or water company threatened to shut off services in your home? : No  Health Literacy: Not on file    Allergies:  Allergies  Allergen Reactions   Cashew Nut Oil Anaphylaxis   Corylus Anaphylaxis   Justicia Adhatoda (Malabar Nut Tree) [Justicia Adhatoda] Anaphylaxis   Peanut-Containing Drug Products Anaphylaxis   Tree Extract Anaphylaxis   Cephalosporins Nausea And Vomiting and Other (See Comments)   Penicillins Nausea And Vomiting and Other (See Comments)    When she was a child   Naproxen Other (See Comments), Rash and Hives    Current Medications: Current Outpatient Medications  Medication Sig Dispense Refill   Continuous Blood Gluc Sensor (DEXCOM G7 SENSOR) MISC CHANGE EVERY 10 DAYS (90 DAY SUPPLY)     Continuous Glucose Sensor (DEXCOM G7 SENSOR) MISC 1 Device by Does not apply route continuous. 9 each 0   Continuous Glucose Sensor (DEXCOM G7 SENSOR) MISC USE 1 SENSOR CONTINUOUSLY 9 each 3   EPINEPHrine  0.3 mg/0.3 mL IJ SOAJ injection Inject 0.3 mg into the muscle as needed for anaphylaxis. 1 each 0   fluconazole  (DIFLUCAN ) 150 MG tablet Take 1 tablet (150 mg total) by mouth daily. May take 3 doses on day 1,3 &7. 3 tablet 0   HUMALOG  100 UNIT/ML  injection Use up to 100 units with pump as needed 30 mL 5   insulin  glargine (SEMGLEE, YFGN,) 100 UNIT/ML Solostar Pen Inject 35 units nightly if  there is a pump failure     Insulin  Human (INSULIN  PUMP) SOLN Inject into the skin 3 times daily with meals, bedtime and 2 AM.     lamoTRIgine  (LAMICTAL ) 100 MG tablet Take 1 tablet (100 mg total) by mouth daily. 90 tablet 0   loratadine (CLARITIN) 10 MG tablet Take 10 mg by mouth once.     Probiotic Product (PROBIOTIC DAILY PO) Take by mouth.     venlafaxine  XR (EFFEXOR -XR) 150 MG 24 hr capsule Take 1 capsule (150 mg total) by mouth daily. Take with 75 mg tab for a total of 225 mg daily 90 capsule 1   venlafaxine  XR (EFFEXOR -XR) 75 MG 24 hr capsule TAKE 1 CAPSULE WITH 150 MG DOSE FOR TOTAL OF 225 MG DAILY (PATIENT MUST KEEP APPOINTMENT FOR FUTURE REFILLS) 90 capsule 1   No current facility-administered medications for this visit.     Psychiatric Specialty Exam: Review of Systems  There were no vitals taken for this visit.There is no height or weight on file to calculate BMI.  General Appearance: Well Groomed  Eye Contact:  Good  Speech:  Clear and Coherent  Volume:  Normal  Mood:  Euthymic  Affect:  Congruent  Thought Process:  Coherent  Orientation:  Full (Time, Place, and Person)  Thought Content: Logical   Suicidal Thoughts:  No  Homicidal Thoughts:  No  Memory:  Immediate;   Good  Judgement:  Good  Insight:  Good  Psychomotor Activity:  Normal  Concentration:  Concentration: Good  Recall:  Good  Fund of Knowledge: Good  Language: Good  Akathisia:  NA    AIMS (if indicated): not done  Assets:  Architect Physical Health Resilience  ADL's:  Intact  Cognition: WNL  Sleep:  Good   Metabolic Disorder Labs: Lab Results  Component Value Date   HGBA1C 9.9 (H) 03/09/2023   No results found for: PROLACTIN Lab Results  Component Value Date   CHOL 181 10/12/2023   TRIG 42 10/12/2023    HDL 70 10/12/2023   CHOLHDL 2.6 10/12/2023   VLDL 8.6 03/09/2023   LDLCALC 102 (H) 10/12/2023   LDLCALC 82 03/09/2023   Lab Results  Component Value Date   TSH 0.765 10/12/2023    Therapeutic Level Labs: No results found for: LITHIUM No results found for: VALPROATE No results found for: CBMZ   Screenings: PHQ2-9    Flowsheet Row [facility]  PHQ-2 Total Score 1   Flowsheet Row UC from 11/30/2023 in [facility] Office Visit from 06/13/2022 [facility] ED from 06/02/2022 in [facility] Department [facility]  C-SSRS RISK CATEGORY No Risk No Risk No Risk    Collaboration of Care: Collaboration of Care: Medication Management AEB medication prescription and Other provider involved in patient's care AEB Endocrinology chart review  Patient/Guardian was advised Release of Information must be obtained prior to any record release in order to collaborate their care with an outside provider. Patient/Guardian was advised if they have not already done so to contact the registration department to sign all necessary forms in order for us  to release information regarding their care.   Consent: Patient/Guardian gives verbal consent for treatment and assignment of benefits for services provided during this visit. Patient/Guardian expressed understanding and agreed to proceed.    Arvella CHRISTELLA Finder, MD 11/13/2024, 2:31 PM   Virtual Visit via Video  Note  I connected with Ashonte Peron on 11/13/24 at  2:00 PM EST by a video enabled telemedicine application and verified that I am speaking with the correct person using two identifiers.  Location: Patient: Home Provider: Home Office   I discussed the limitations of evaluation and management by telemedicine and the availability of in person appointments. The patient  expressed understanding and agreed to proceed.   I discussed the assessment and treatment plan with the patient. The patient was provided an opportunity to ask questions and all were answered. The patient agreed with the plan and demonstrated an understanding of the instructions.   The patient was advised to call back or seek an in-person evaluation if the symptoms worsen or if the condition fails to improve as anticipated.  I provided 15 minutes of non-face-to-face time during this encounter.   Arvella CHRISTELLA Finder, MD

## 2024-11-13 ENCOUNTER — Encounter (HOSPITAL_COMMUNITY): Payer: Self-pay | Admitting: Psychiatry

## 2024-11-13 ENCOUNTER — Telehealth (HOSPITAL_COMMUNITY): Admitting: Psychiatry

## 2024-11-13 DIAGNOSIS — F319 Bipolar disorder, unspecified: Secondary | ICD-10-CM

## 2024-11-13 DIAGNOSIS — F4310 Post-traumatic stress disorder, unspecified: Secondary | ICD-10-CM | POA: Diagnosis not present

## 2024-11-13 MED ORDER — VENLAFAXINE HCL ER 150 MG PO CP24: 150.0000 mg | ORAL_CAPSULE | Freq: Every day | ORAL | 1 refills | Status: AC

## 2024-11-13 MED ORDER — VENLAFAXINE HCL ER 75 MG PO CP24: ORAL_CAPSULE | ORAL | 1 refills | Status: AC

## 2024-11-13 MED ORDER — LAMOTRIGINE 100 MG PO TABS: 100.0000 mg | ORAL_TABLET | Freq: Every day | ORAL | 0 refills | Status: AC

## 2025-04-23 ENCOUNTER — Telehealth (HOSPITAL_COMMUNITY): Admitting: Psychiatry
